# Patient Record
Sex: Female | Born: 1985
Health system: Southern US, Community
[De-identification: ages and names within clinical notes are randomized; demographics above are authoritative.]

## PROBLEM LIST (undated history)

## (undated) DIAGNOSIS — IMO0002 Reserved for concepts with insufficient information to code with codable children: Secondary | ICD-10-CM

## (undated) DIAGNOSIS — R7989 Other specified abnormal findings of blood chemistry: Secondary | ICD-10-CM

---

## 2008-09-24 ENCOUNTER — Emergency Department (HOSPITAL_BASED_OUTPATIENT_CLINIC_OR_DEPARTMENT_OTHER): Admission: EM | Admit: 2008-09-24 | Discharge: 2008-09-24 | Payer: Self-pay | Admitting: Emergency Medicine

## 2008-09-24 ENCOUNTER — Ambulatory Visit: Payer: Self-pay | Admitting: Radiology

## 2008-12-14 ENCOUNTER — Emergency Department (HOSPITAL_BASED_OUTPATIENT_CLINIC_OR_DEPARTMENT_OTHER): Admission: EM | Admit: 2008-12-14 | Discharge: 2008-12-14 | Payer: Self-pay | Admitting: Emergency Medicine

## 2008-12-15 ENCOUNTER — Emergency Department (HOSPITAL_BASED_OUTPATIENT_CLINIC_OR_DEPARTMENT_OTHER): Admission: EM | Admit: 2008-12-15 | Discharge: 2008-12-16 | Payer: Self-pay | Admitting: Emergency Medicine

## 2010-07-08 LAB — BASIC METABOLIC PANEL
CO2: 27 mEq/L (ref 19–32)
Calcium: 9.5 mg/dL (ref 8.4–10.5)
Chloride: 102 mEq/L (ref 96–112)
Chloride: 104 mEq/L (ref 96–112)
GFR calc Af Amer: >60 mL/min (ref >60)
GFR calc non Af Amer: 55 mL/min — ABNORMAL LOW (ref >60)
Glucose, Bld: 136 mg/dL — ABNORMAL HIGH (ref 70–99)
Potassium: 3.8 mEq/L (ref 3.5–5.1)
Sodium: 140 mEq/L (ref 135–145)
Sodium: 140 mEq/L (ref 135–145)

## 2010-07-08 LAB — DIFFERENTIAL
Basophils Absolute: 0 10*3/uL (ref 0.0–0.1)
Basophils Relative: 1 % (ref 0–1)
Eosinophils Absolute: 0.1 10*3/uL (ref 0.0–0.7)
Eosinophils Absolute: 0.1 10*3/uL (ref 0.0–0.7)
Eosinophils Relative: 1 % (ref 0–5)
Eosinophils Relative: 2 % (ref 0–5)
Lymphocytes Relative: 38 % (ref 12–46)
Lymphs Abs: 2.4 10*3/uL (ref 0.7–4.0)
Monocytes Absolute: 0.5 10*3/uL (ref 0.1–1.0)
Monocytes Relative: 6 % (ref 3–12)
Monocytes Relative: 8 % (ref 3–12)
Neutro Abs: 5.3 10*3/uL (ref 1.7–7.7)

## 2010-07-08 LAB — CBC
HCT: 47.8 % — ABNORMAL HIGH (ref 36.0–46.0)
Hemoglobin: 14.7 g/dL (ref 12.0–15.0)
Hemoglobin: 16 g/dL — ABNORMAL HIGH (ref 12.0–15.0)
MCHC: 33 g/dL (ref 30.0–36.0)
MCV: 83.3 fL (ref 78.0–100.0)
MCV: 83.7 fL (ref 78.0–100.0)
RBC: 5.32 MIL/uL — ABNORMAL HIGH (ref 3.87–5.11)
RBC: 5.74 MIL/uL — ABNORMAL HIGH (ref 3.87–5.11)
RDW: 13 % (ref 11.5–15.5)
WBC: 6.2 10*3/uL (ref 4.0–10.5)

## 2010-07-08 LAB — URINALYSIS, ROUTINE W REFLEX MICROSCOPIC
Glucose, UA: NEGATIVE mg/dL
Ketones, ur: 40 mg/dL — AB
Protein, ur: 100 mg/dL — AB

## 2010-07-08 LAB — GC/CHLAMYDIA PROBE AMP, GENITAL
Chlamydia, DNA Probe: NEGATIVE
GC Probe Amp, Genital: NEGATIVE

## 2010-07-08 LAB — URINE CULTURE: Colony Count: 30000

## 2010-07-08 LAB — WET PREP, GENITAL: WBC, Wet Prep HPF POC: NONE SEEN

## 2010-07-08 LAB — URINE MICROSCOPIC-ADD ON

## 2011-03-29 ENCOUNTER — Emergency Department (HOSPITAL_BASED_OUTPATIENT_CLINIC_OR_DEPARTMENT_OTHER)
Admission: EM | Admit: 2011-03-29 | Discharge: 2011-03-29 | Disposition: A | Discharge status: Home / Self Care | Payer: 59 | Attending: Emergency Medicine | Admitting: Emergency Medicine

## 2011-03-29 ENCOUNTER — Encounter: Payer: Self-pay | Admitting: *Deleted

## 2011-03-29 DIAGNOSIS — J069 Acute upper respiratory infection, unspecified: Secondary | ICD-10-CM | POA: Insufficient documentation

## 2011-03-29 DIAGNOSIS — J029 Acute pharyngitis, unspecified: Secondary | ICD-10-CM | POA: Insufficient documentation

## 2011-03-29 DIAGNOSIS — IMO0001 Reserved for inherently not codable concepts without codable children: Secondary | ICD-10-CM | POA: Insufficient documentation

## 2011-03-29 DIAGNOSIS — H6690 Otitis media, unspecified, unspecified ear: Secondary | ICD-10-CM | POA: Insufficient documentation

## 2011-03-29 DIAGNOSIS — H6691 Otitis media, unspecified, right ear: Secondary | ICD-10-CM

## 2011-03-29 MED ORDER — HYDROCOD POLST-CHLORPHEN POLST 10-8 MG/5ML PO LQCR: 5.0000 mL | Freq: Two times a day (BID) | ORAL | Status: DC | PRN

## 2011-03-29 MED ORDER — AMOXICILLIN 500 MG PO CAPS
500.0000 mg | ORAL_CAPSULE | Freq: Once | ORAL | Status: AC
  Administered 2011-03-29: 500 mg via ORAL
  Filled 2011-03-29: qty 1

## 2011-03-29 MED ORDER — AMOXICILLIN 500 MG PO CAPS: 500.0000 mg | ORAL_CAPSULE | Freq: Three times a day (TID) | ORAL | Status: AC

## 2011-03-29 NOTE — ED Notes (Signed)
Pt reports sore throat, congestion, cough and right sided earache xseveral days. Denies fevers. Taking cold/flu meds without relief.

## 2011-03-29 NOTE — ED Provider Notes (Signed)
History     CSN: [id number]  Arrival date & time 03/29/11  0547   First MD Initiated Contact with Patient 03/29/11 [phone]      Chief Complaint  Patient presents with  . URI    (Consider location/radiation/quality/duration/timing/severity/associated sxs/prior treatment) HPI Comments: Pt. Reporting nasal congestion, rhinorrhea, non-productive cough, right ear pain for 3 days without fever/chills, nausea/vomiting.  Her son is sick with similar symptoms.  Patient is a 25 y.o. female presenting with URI. The history is provided by the patient.  URI The primary symptoms include fatigue, headaches, ear pain, sore throat, swollen glands, cough and myalgias. Primary symptoms do not include fever, wheezing, abdominal pain, nausea, vomiting, arthralgias or rash. The current episode started 3 to 5 days ago. This is a new problem. The problem has been gradually worsening.  The headache is not associated with photophobia, eye pain, neck stiffness or weakness.  The sore throat is not accompanied by trouble swallowing, drooling or stridor.  The swelling is not associated with trouble swallowing or neck pain.  The myalgias are not associated with weakness.  The onset of the illness is associated with exposure to sick contacts. Symptoms associated with the illness include chills, plugged ear sensation, congestion and rhinorrhea. The illness is not associated with facial pain or sinus pressure.    History reviewed. No pertinent past medical history.  History reviewed. No pertinent past surgical history.  No family history on file.  History  Substance Use Topics  . Smoking status: Never Smoker   . Smokeless tobacco: Not on file  . Alcohol Use: Yes     occasionally    OB History    Grav Para Term Preterm Abortions TAB SAB Ect Mult Living                  Review of Systems  Constitutional: Positive for chills and fatigue. Negative for fever, diaphoresis, activity change, appetite change and  unexpected weight change.  HENT: Positive for ear pain, congestion, sore throat, rhinorrhea and postnasal drip. Negative for hearing loss, nosebleeds, facial swelling, sneezing, drooling, mouth sores, trouble swallowing, neck pain, neck stiffness, dental problem, voice change, sinus pressure, tinnitus and ear discharge.   Eyes: Negative for photophobia, pain, discharge, redness and itching.  Respiratory: Positive for cough. Negative for choking, chest tightness, shortness of breath, wheezing and stridor.   Cardiovascular: Negative for chest pain, palpitations and leg swelling.  Gastrointestinal: Negative for nausea, vomiting, abdominal pain, diarrhea, constipation, blood in stool, abdominal distention and anal bleeding.  Genitourinary: Negative for dysuria, urgency, frequency, hematuria, flank pain and difficulty urinating.  Musculoskeletal: Positive for myalgias. Negative for back pain, joint swelling, arthralgias and gait problem.  Skin: Negative for color change, pallor, rash and wound.  Neurological: Positive for headaches. Negative for dizziness, weakness and light-headedness.  Hematological: Positive for adenopathy. Does not bruise/bleed easily.  Psychiatric/Behavioral: Negative.     Allergies  Review of patient's allergies indicates no known allergies.  Home Medications   Current Outpatient Rx  Name Route Sig Dispense Refill  . AMOXICILLIN 500 MG PO CAPS Oral Take 1 capsule (500 mg total) by mouth 3 (three) times daily. 30 capsule 0  . HYDROCOD POLST-CHLORPHEN POLST 10-8 MG/5ML PO LQCR Oral Take 5 mLs by mouth every 12 (twelve) hours as needed (Cough). 115 mL 0    BP 115/82  Pulse 87  Temp(Src) 98.2 F (36.8 C) (Oral)  Resp 18  Ht 5\' 2"  (1.575 m)  Wt 150 lb (68.04 kg)  BMI 27.44 kg/m2  SpO2 100%  LMP 01/17/2011  Physical Exam  Nursing note and vitals reviewed. Constitutional: She is oriented to person, place, and time. She appears well-developed and well-nourished. No  distress.  HENT:  Head: Normocephalic and atraumatic. No trismus in the jaw.  Right Ear: Hearing, external ear and ear canal normal. No drainage or tenderness. No mastoid tenderness. Tympanic membrane is erythematous and bulging.  Left Ear: Hearing, tympanic membrane, external ear and ear canal normal. No mastoid tenderness. Tympanic membrane is not erythematous and not bulging.  Nose: Mucosal edema and rhinorrhea present. No sinus tenderness or nasal deformity. Right sinus exhibits no maxillary sinus tenderness and no frontal sinus tenderness. Left sinus exhibits frontal sinus tenderness. Left sinus exhibits no maxillary sinus tenderness.  Mouth/Throat: Uvula is midline and mucous membranes are normal. No oral lesions. No uvula swelling. Posterior oropharyngeal edema and posterior oropharyngeal erythema present. No oropharyngeal exudate or tonsillar abscesses.  Eyes: Conjunctivae and EOM are normal. Pupils are equal, round, and reactive to light. Right eye exhibits no discharge. Left eye exhibits no discharge.  Neck: Normal range of motion. Neck supple. No rigidity.  Cardiovascular: Normal rate and regular rhythm.  Exam reveals no gallop and no friction rub.   No murmur heard. Pulmonary/Chest: Effort normal and breath sounds normal. No respiratory distress. She has no wheezes. She has no rales.  Abdominal: Soft. Bowel sounds are normal.  Musculoskeletal: Normal range of motion. She exhibits no edema and no tenderness.  Lymphadenopathy:    She has cervical adenopathy.       Right cervical: Superficial cervical adenopathy present.       Left cervical: Superficial cervical adenopathy present.  Neurological: She is alert and oriented to person, place, and time. No cranial nerve deficit.  Skin: Skin is warm and dry. No rash noted. No erythema.  Psychiatric: She has a normal mood and affect.    ED Course  Procedures (including critical care time)  Labs Reviewed - No data to display No results  found.   1. Right otitis media   2. Pharyngitis   3. Viral upper respiratory tract infection with cough       MDM  Apparent viral URI with cough without suggestion of pneumonia on examination, but with apparent right otitis media and pharyngitis.       Felisa Bonier, MD 03/29/11 [phone]

## 2013-01-09 ENCOUNTER — Encounter (HOSPITAL_BASED_OUTPATIENT_CLINIC_OR_DEPARTMENT_OTHER): Payer: Self-pay | Admitting: Emergency Medicine

## 2013-01-09 ENCOUNTER — Emergency Department (HOSPITAL_BASED_OUTPATIENT_CLINIC_OR_DEPARTMENT_OTHER)
Admission: EM | Admit: 2013-01-09 | Discharge: 2013-01-09 | Disposition: A | Discharge status: Home / Self Care | Payer: 59 | Attending: Emergency Medicine | Admitting: Emergency Medicine

## 2013-01-09 ENCOUNTER — Emergency Department (HOSPITAL_BASED_OUTPATIENT_CLINIC_OR_DEPARTMENT_OTHER): Payer: 59

## 2013-01-09 DIAGNOSIS — Y939 Activity, unspecified: Secondary | ICD-10-CM | POA: Insufficient documentation

## 2013-01-09 DIAGNOSIS — T148XXA Other injury of unspecified body region, initial encounter: Secondary | ICD-10-CM

## 2013-01-09 DIAGNOSIS — Y929 Unspecified place or not applicable: Secondary | ICD-10-CM | POA: Insufficient documentation

## 2013-01-09 DIAGNOSIS — S9030XA Contusion of unspecified foot, initial encounter: Secondary | ICD-10-CM | POA: Insufficient documentation

## 2013-01-09 DIAGNOSIS — IMO0002 Reserved for concepts with insufficient information to code with codable children: Secondary | ICD-10-CM | POA: Insufficient documentation

## 2013-01-09 HISTORY — DX: Other specified abnormal findings of blood chemistry: R79.89

## 2013-01-09 HISTORY — DX: Reserved for concepts with insufficient information to code with codable children: IMO0002

## 2013-01-09 MED ORDER — ONDANSETRON 8 MG PO TBDP: 8.0000 mg | ORAL_TABLET | Freq: Three times a day (TID) | ORAL | Status: DC | PRN

## 2013-01-09 MED ORDER — HYDROCODONE-ACETAMINOPHEN 5-325 MG PO TABS
1.0000 | ORAL_TABLET | Freq: Once | ORAL | Status: AC
  Administered 2013-01-09: 1 via ORAL
  Filled 2013-01-09: qty 1

## 2013-01-09 MED ORDER — HYDROCODONE-ACETAMINOPHEN 5-325 MG PO TABS: 1.0000 | ORAL_TABLET | Freq: Four times a day (QID) | ORAL | Status: DC | PRN

## 2013-01-09 NOTE — ED Notes (Signed)
Pt reports sharp pain since yest on R foot. She report pain has been increasingly worsening.

## 2013-01-09 NOTE — ED Provider Notes (Signed)
CSN: [id number]     Arrival date & time 01/09/13  0711 History   First MD Initiated Contact with Patient 01/09/13 0720     Chief Complaint  Patient presents with  . Foot Pain    R   (Consider location/radiation/quality/duration/timing/severity/associated sxs/prior Treatment) HPI Comments: Pt comes in with right foot pain. Pt had a blunt injury few days back, and started having pain on Wednesday. The pain woke her up from sleep, ans tus she comes to the ED. She has pain with ambulation, and it is throbbing. The pain is located only on the 5th toe, the base of the toe and small area proximal to the toe.   Patient is a 27 y.o. female presenting with lower extremity pain. The history is provided by the patient.  Foot Pain    Past Medical History  Diagnosis Date  . Creatinine elevation     reported by patinet on physical yrs ago   History reviewed. No pertinent past surgical history. History reviewed. No pertinent family history. History  Substance Use Topics  . Smoking status: Never Smoker   . Smokeless tobacco: Not on file  . Alcohol Use: Yes     Comment: occasionally   OB History   Grav Para Term Preterm Abortions TAB SAB Ect Mult Living                 Review of Systems  Constitutional: Positive for activity change.  Musculoskeletal: Positive for arthralgias.  Skin: Negative for rash and wound.  Hematological: Does not bruise/bleed easily.    Allergies  Review of patient's allergies indicates no known allergies.  Home Medications  No current outpatient prescriptions on file. BP 137/79  Pulse 98  Temp(Src) 98.6 F (37 C)  Resp 18  Ht 5\' 2"  (1.575 m)  Wt 150 lb (68.04 kg)  BMI 27.43 kg/m2  SpO2 99% Physical Exam  Nursing note and vitals reviewed. Constitutional: She is oriented to person, place, and time. She appears well-developed.  HENT:  Head: Atraumatic.  Eyes: EOM are normal.  Neck: Neck supple.  Pulmonary/Chest: Effort normal.  Musculoskeletal:   Right 5 toe, MTP and area just proximal to MRP are tender to palpation.  Neurological: She is alert and oriented to person, place, and time.    ED Course  Procedures (including critical care time) Labs Review Labs Reviewed - No data to display Imaging Review No results found.  MDM  No diagnosis found.  Pt comes in with cc of foot pain. She had blunt trauma to the small toe. Xrays are normal, no evidence of jones fx. Mild soft tissue swelling. Will get a walker boot, partial weight bearing, RICE and sports med f.u,     Derwood Kaplan, MD 01/09/13 [phone]

## 2013-01-09 NOTE — ED Notes (Signed)
MD at bedside. 

## 2013-01-09 NOTE — ED Notes (Signed)
Patient transported to X-ray 

## 2013-11-01 ENCOUNTER — Encounter (HOSPITAL_BASED_OUTPATIENT_CLINIC_OR_DEPARTMENT_OTHER): Payer: Self-pay | Admitting: Emergency Medicine

## 2013-11-01 ENCOUNTER — Emergency Department (HOSPITAL_BASED_OUTPATIENT_CLINIC_OR_DEPARTMENT_OTHER)
Admission: EM | Admit: 2013-11-01 | Discharge: 2013-11-01 | Discharge status: Against Medical Advice | Payer: BC Managed Care – PPO | Attending: Emergency Medicine | Admitting: Emergency Medicine

## 2013-11-01 DIAGNOSIS — K625 Hemorrhage of anus and rectum: Secondary | ICD-10-CM | POA: Insufficient documentation

## 2013-11-01 NOTE — ED Notes (Addendum)
Patient states that she has had rectal bleeding since Wednesday. States it is bright red. She saw a GI dr on Friday and has an appt for a colonoscopy next month. States that he said he saw a small tear, but wasn't sure if that was the source. Patient also states that she is having a hard time "digesting food" and is taking OTC medications to help with that. Was supposed to get bloodwork today that the GI dr ordered, but did not go.

## 2014-06-11 ENCOUNTER — Emergency Department (HOSPITAL_BASED_OUTPATIENT_CLINIC_OR_DEPARTMENT_OTHER): Payer: 59

## 2014-06-11 ENCOUNTER — Encounter (HOSPITAL_BASED_OUTPATIENT_CLINIC_OR_DEPARTMENT_OTHER): Payer: Self-pay | Admitting: *Deleted

## 2014-06-11 ENCOUNTER — Emergency Department (HOSPITAL_BASED_OUTPATIENT_CLINIC_OR_DEPARTMENT_OTHER)
Admission: EM | Admit: 2014-06-11 | Discharge: 2014-06-11 | Disposition: A | Discharge status: Home / Self Care | Payer: 59 | Attending: Emergency Medicine | Admitting: Emergency Medicine

## 2014-06-11 DIAGNOSIS — M2012 Hallux valgus (acquired), left foot: Secondary | ICD-10-CM | POA: Insufficient documentation

## 2014-06-11 DIAGNOSIS — M79672 Pain in left foot: Secondary | ICD-10-CM

## 2014-06-11 DIAGNOSIS — Z79899 Other long term (current) drug therapy: Secondary | ICD-10-CM | POA: Insufficient documentation

## 2014-06-11 DIAGNOSIS — M21612 Bunion of left foot: Secondary | ICD-10-CM

## 2014-06-11 MED ORDER — IBUPROFEN 400 MG PO TABS
400.0000 mg | ORAL_TABLET | Freq: Once | ORAL | Status: AC
  Administered 2014-06-11: 400 mg via ORAL
  Filled 2014-06-11: qty 1

## 2014-06-11 MED ORDER — HYDROCODONE-ACETAMINOPHEN 5-325 MG PO TABS: 1.0000 | ORAL_TABLET | Freq: Four times a day (QID) | ORAL | Status: DC | PRN

## 2014-06-11 MED ORDER — HYDROCODONE-ACETAMINOPHEN 5-325 MG PO TABS
1.0000 | ORAL_TABLET | Freq: Once | ORAL | Status: AC
Start: 2014-06-11 — End: 2014-06-11
  Administered 2014-06-11: 1 via ORAL
  Filled 2014-06-11: qty 1

## 2014-06-11 NOTE — Discharge Instructions (Signed)
You were seen today for foot pain. You appear to have a bunion on her foot and you may have some tendinitis in your toe. Your x-rays were negative. You need to take pain medication and rest her foot for several days. If you have continued pain, you should follow-up as directed.  Bunion You have a bunion deformity of the feet. This is more common in women. It tends to be an inherited problem. Symptoms can include pain, swelling, and deformity around the great toe. Numbness and tingling may also be present. Your symptoms are often worsened by wearing shoes that cause pressure on the bunion. Changing the type of shoes you wear helps reduce symptoms. A wide shoe decreases pressure on the bunion. An arch support may be used if you have flat feet. Avoid shoes with heels higher than two inches. This puts more pressure on the bunion. X-rays may be helpful in evaluating the severity of the problem. Other foot problems often seen with bunions include corns, calluses, and hammer toes. If the deformity or pain is severe, surgical treatment may be necessary. Keep off your painful foot as much as possible until the pain is relieved. Call your caregiver if your symptoms are worse.  SEEK IMMEDIATE MEDICAL CARE IF:  You have increased redness, pain, swelling, or other symptoms of infection. Document Released: 03/20/2005 Document Revised: 06/12/2011 Document Reviewed: 09/17/2006 [facility] Patient Information 2015 [employer], [place]. This information is not intended to replace advice given to you by your health care provider. Make sure you discuss any questions you have with your health care provider.

## 2014-06-11 NOTE — ED Notes (Signed)
Pt slightly nauseas. Requesting crackers and ice water. Will d/c when nausea resolves.

## 2014-06-11 NOTE — ED Notes (Signed)
Patient transported to X-ray 

## 2014-06-11 NOTE — ED Notes (Signed)
MD at bedside. 

## 2014-06-11 NOTE — ED Provider Notes (Signed)
CSN: [id number]     Arrival date & time 06/11/14  7846 History   First MD Initiated Contact with Patient 06/11/14 0802     Chief Complaint  Patient presents with  . Foot Pain     (Consider location/radiation/quality/duration/timing/severity/associated sxs/prior Treatment) HPI  This is a 29 year old female who presents with left foot pain. Patient reports dull throbbing pain over the left great toe and foot for the last 2-3 days. She woke up this morning and pain was more intense. She denies any injury. Currently her pain is 8 out of 10. She's not taking anything at home. Pain is worse with ambulation and flexion and extension of the toe. She denies any fevers or redness of the toe. No known history of gout.  Past Medical History  Diagnosis Date  . Creatinine elevation     reported by patinet on physical yrs ago   History reviewed. No pertinent past surgical history. No family history on file. History  Substance Use Topics  . Smoking status: Never Smoker   . Smokeless tobacco: Not on file  . Alcohol Use: Yes     Comment: occasionally   OB History    No data available     Review of Systems  Constitutional: Negative for fever.  Musculoskeletal:       Left foot pain  All other systems reviewed and are negative.     Allergies  Review of patient's allergies indicates no known allergies.  Home Medications   Prior to Admission medications   Medication Sig Start Date End Date Taking? Authorizing Provider  cholecalciferol (VITAMIN D) 1000 UNITS tablet Take 1,000 Units by mouth daily.   Yes Historical Provider, MD  HYDROcodone-acetaminophen (NORCO/VICODIN) 5-325 MG per tablet Take 1 tablet by mouth every 6 (six) hours as needed for moderate pain. 06/11/14   Shon Baton, MD  ondansetron (ZOFRAN ODT) 8 MG disintegrating tablet Take 1 tablet (8 mg total) by mouth every 8 (eight) hours as needed for nausea. 01/09/13   Ankit Rhunette Croft, MD   BP 113/73 mmHg  Pulse 64  Temp(Src)  99.3 F (37.4 C) (Oral)  Resp 16  Ht  (1.575 m)  Wt 166 lb (75.297 kg)  BMI 30.35 kg/m2  SpO2 100%  LMP 06/10/2014 Physical Exam  Constitutional: She is oriented to person, place, and time. She appears well-developed and well-nourished.  HENT:  Head: Normocephalic and atraumatic.  Cardiovascular: Normal rate.   Pulmonary/Chest: Effort normal. No respiratory distress.  Musculoskeletal:  Focused examination of the left foot reveals bunion adjacent to the great toe, limited range of motion of the toe secondary to pain, no obvious swelling, no redness or overlying erythema, nails and nail beds appear intact, no tenderness over the plantar fascia  Neurological: She is alert and oriented to person, place, and time.  Skin: Skin is warm and dry.  Psychiatric: She has a normal mood and affect.  Nursing note and vitals reviewed.   ED Course  Procedures (including critical care time) Labs Review Labs Reviewed - No data to display  Imaging Review Dg Foot Complete Left  06/11/2014   CLINICAL DATA:  Woke this morning with pain in left big toe.  EXAM: LEFT FOOT - COMPLETE 3+ VIEW  COMPARISON:  None.  FINDINGS: There is no evidence of fracture or dislocation. There is no evidence of arthropathy or other focal bone abnormality. Soft tissues are unremarkable.  IMPRESSION: Negative.   Electronically Signed   By: Veronda Prude.D.  On: 06/11/2014 08:32     EKG Interpretation None      MDM   Final diagnoses:  Foot pain, left  Bunion of great toe of left foot    Patient presents with left foot pain. Has a bunion of the left great toe but otherwise exam is reassuring. X-ray negative for acute fracture. Low suspicion at this time for septic joint or gout given no overlying skin changes and range of motion intact of the great toe. Discussed with patient conservative management with pain medication and rest. She does not tolerate ibuprofen so will be given a short course of Norco. She is to  follow-up with Dr. Pearletha ForgeHudnall if symptoms continue.  After history, exam, and medical workup I feel the patient has been appropriately medically screened and is safe for discharge home. Pertinent diagnoses were discussed with the patient. Patient was given return precautions.     Shon Batonourtney F Almalik Weissberg, MD 06/11/14 [phone]

## 2014-06-11 NOTE — ED Notes (Signed)
Pt c/o mild left foot pain x2 days but woke up this morning with severe pain. Pt denies injury.

## 2014-08-27 ENCOUNTER — Ambulatory Visit: Payer: 59 | Admitting: Podiatry

## 2015-10-07 ENCOUNTER — Encounter (HOSPITAL_BASED_OUTPATIENT_CLINIC_OR_DEPARTMENT_OTHER): Payer: Self-pay | Admitting: *Deleted

## 2015-10-07 ENCOUNTER — Emergency Department (HOSPITAL_BASED_OUTPATIENT_CLINIC_OR_DEPARTMENT_OTHER)
Admission: EM | Admit: 2015-10-07 | Discharge: 2015-10-07 | Disposition: A | Discharge status: Home / Self Care | Payer: 59 | Attending: Emergency Medicine | Admitting: Emergency Medicine

## 2015-10-07 ENCOUNTER — Emergency Department (HOSPITAL_BASED_OUTPATIENT_CLINIC_OR_DEPARTMENT_OTHER): Payer: 59

## 2015-10-07 DIAGNOSIS — Y999 Unspecified external cause status: Secondary | ICD-10-CM | POA: Insufficient documentation

## 2015-10-07 DIAGNOSIS — Z79899 Other long term (current) drug therapy: Secondary | ICD-10-CM | POA: Diagnosis not present

## 2015-10-07 DIAGNOSIS — Y929 Unspecified place or not applicable: Secondary | ICD-10-CM | POA: Diagnosis not present

## 2015-10-07 DIAGNOSIS — M25572 Pain in left ankle and joints of left foot: Secondary | ICD-10-CM | POA: Diagnosis present

## 2015-10-07 DIAGNOSIS — X501XXA Overexertion from prolonged static or awkward postures, initial encounter: Secondary | ICD-10-CM | POA: Diagnosis not present

## 2015-10-07 DIAGNOSIS — S93402A Sprain of unspecified ligament of left ankle, initial encounter: Secondary | ICD-10-CM

## 2015-10-07 DIAGNOSIS — Y9341 Activity, dancing: Secondary | ICD-10-CM | POA: Insufficient documentation

## 2015-10-07 MED ORDER — HYDROCODONE-ACETAMINOPHEN 5-325 MG PO TABS: 2.0000 | ORAL_TABLET | ORAL | Status: AC | PRN

## 2015-10-07 MED ORDER — IBUPROFEN 400 MG PO TABS: 400.0000 mg | ORAL_TABLET | Freq: Four times a day (QID) | ORAL | Status: AC | PRN

## 2015-10-07 MED FILL — IBUPROFEN 400 MG TABLET: 400 | 7 days supply | Qty: 30 | Fill #0

## 2015-10-07 MED FILL — HYDROCODON-APAP 5-325: 5-325 | 2 days supply | Qty: 6 | Fill #0

## 2015-10-07 NOTE — Discharge Instructions (Signed)
Ankle Sprain °An ankle sprain is an injury to the strong, fibrous tissues (ligaments) that hold the bones of your ankle joint together.  °CAUSES °An ankle sprain is usually caused by a fall or by twisting your ankle. Ankle sprains most commonly occur when you step on the outer edge of your foot, and your ankle turns inward. People who participate in sports are more prone to these types of injuries.  °SYMPTOMS  °· Pain in your ankle. The pain may be present at rest or only when you are trying to stand or walk. °· Swelling. °· Bruising. Bruising may develop immediately or within 1 to 2 days after your injury. °· Difficulty standing or walking, particularly when turning corners or changing directions. °DIAGNOSIS  °Your caregiver will ask you details about your injury and perform a physical exam of your ankle to determine if you have an ankle sprain. During the physical exam, your caregiver will press on and apply pressure to specific areas of your foot and ankle. Your caregiver will try to move your ankle in certain ways. An X-ray exam may be done to be sure a bone was not broken or a ligament did not separate from one of the bones in your ankle (avulsion fracture).  °TREATMENT  °Certain types of braces can help stabilize your ankle. Your caregiver can make a recommendation for this. Your caregiver may recommend the use of medicine for pain. If your sprain is severe, your caregiver may refer you to a surgeon who helps to restore function to parts of your skeletal system (orthopedist) or a physical therapist. °HOME CARE INSTRUCTIONS  °· Apply ice to your injury for 1-2 days or as directed by your caregiver. Applying ice helps to reduce inflammation and pain. °¨ Put ice in a plastic bag. °¨ Place a towel between your skin and the bag. °¨ Leave the ice on for 15-20 minutes at a time, every 2 hours while you are awake. °· Only take over-the-counter or prescription medicines for pain, discomfort, or fever as directed by  your caregiver. °· Elevate your injured ankle above the level of your heart as much as possible for 2-3 days. °· If your caregiver recommends crutches, use them as instructed. Gradually put weight on the affected ankle. Continue to use crutches or a cane until you can walk without feeling pain in your ankle. °· If you have a plaster splint, wear the splint as directed by your caregiver. Do not rest it on anything harder than a pillow for the first 24 hours. Do not put weight on it. Do not get it wet. You may take it off to take a shower or bath. °· You may have been given an elastic bandage to wear around your ankle to provide support. If the elastic bandage is too tight (you have numbness or tingling in your foot or your foot becomes cold and blue), adjust the bandage to make it comfortable. °· If you have an air splint, you may blow more air into it or let air out to make it more comfortable. You may take your splint off at night and before taking a shower or bath. Wiggle your toes in the splint several times per day to decrease swelling. °SEEK MEDICAL CARE IF:  °· You have rapidly increasing bruising or swelling. °· Your toes feel extremely cold or you lose feeling in your foot. °· Your pain is not relieved with medicine. °SEEK IMMEDIATE MEDICAL CARE IF: °· Your toes are numb or blue. °·   You have severe pain that is increasing. MAKE SURE YOU:   Understand these instructions.  Will watch your condition.  Will get help right away if you are not doing well or get worse.   This information is not intended to replace advice given to you by your health care provider. Make sure you discuss any questions you have with your health care provider.   Follow-up with Dr. Pearletha ForgeHudnall  with sports medicine for reevaluation. keep brace on your ankle. Elevate your leg as much as possible. Apply ice to affected area. Take ibuprofen as needed for pain and inflammation. Return to the ED few experience severe worsening of your  symptoms, increased pain or swelling around your ankle, numbness or tingling in your lower extremity, discoloration of your skin, fevers, chills.

## 2015-10-07 NOTE — ED Provider Notes (Signed)
CSN: [id number]     Arrival date & time 10/07/15  1025 History   First MD Initiated Contact with Patient 10/07/15 1044     No chief complaint on file.    (Consider location/radiation/quality/duration/timing/severity/associated sxs/prior Treatment) HPI  Jacqueline Chandler is a 30 y.o F with no significant pmhx who presents to the ED today c/o left ankle pain. Pt states that last night she was dancing and landed on her left ankle the wrong way. Pt states her ankle buckled and she heard it crack. Pt felt immediate pain and her ankle began to swell. She has not been able to apply pressure or ambulate on that ankle since. She has taken a total of 4 hydrocodone without relief.    Past Medical History  Diagnosis Date  . Creatinine elevation     reported by patinet on physical yrs ago   History reviewed. No pertinent past surgical history. No family history on file. Social History  Substance Use Topics  . Smoking status: Never Smoker   . Smokeless tobacco: None  . Alcohol Use: Yes     Comment: occasionally   OB History    No data available     Review of Systems  All other systems reviewed and are negative.     Allergies  Review of patient's allergies indicates no known allergies.  Home Medications   Prior to Admission medications   Medication Sig Start Date End Date Taking? Authorizing Provider  cholecalciferol (VITAMIN D) 1000 UNITS tablet Take 1,000 Units by mouth daily.   Yes Historical Provider, MD  HYDROcodone-acetaminophen (NORCO/VICODIN) 5-325 MG per tablet Take 1 tablet by mouth every 6 (six) hours as needed for moderate pain. 06/11/14  Yes Shon Batonourtney F Horton, MD  Multiple Vitamin (MULTIVITAMIN WITH MINERALS) TABS tablet Take 1 tablet by mouth daily.   Yes Historical Provider, MD   BP 118/79 mmHg  Pulse 74  Temp(Src) 98.5 F (36.9 C) (Oral)  Resp 18  Ht 5\' 2"  (1.575 m)  Wt 70.308 kg  BMI 28.34 kg/m2  SpO2 100%  LMP 10/04/2015 Physical Exam  Constitutional: She is  oriented to person, place, and time. She appears well-developed and well-nourished. No distress.  HENT:  Head: Normocephalic and atraumatic.  Eyes: Conjunctivae are normal. Right eye exhibits no discharge. Left eye exhibits no discharge. No scleral icterus.  Cardiovascular: Normal rate.   Pulmonary/Chest: Effort normal.  Musculoskeletal:  TTP and swelling noted over lateral malleolus and dorsum of left foot. Intact distal pulses. No ecchymosis or erythema. No obvious bony deformity. No TTP over anterior proximal shin. Good capillary refill.  Neurological: She is alert and oriented to person, place, and time. Coordination normal.  Skin: Skin is warm and dry. No rash noted. She is not diaphoretic. No erythema. No pallor.  Psychiatric: She has a normal mood and affect. Her behavior is normal.  Nursing note and vitals reviewed.   ED Course  Procedures (including critical care time) Labs Review Labs Reviewed - No data to display  Imaging Review Dg Ankle Complete Left  10/07/2015  CLINICAL DATA:  Rolled ankle last night while dancing, medial and lateral ankle pain EXAM: LEFT ANKLE COMPLETE - 3+ VIEW COMPARISON:  Left foot 10/07/2015 FINDINGS: Three views of left ankle submitted. No acute fracture or subluxation. Ankle mortise is preserved. No radiopaque foreign body. IMPRESSION: Negative. Electronically Signed   By: Natasha MeadLiviu  Pop M.D.   On: 10/07/2015 11:13   Dg Foot Complete Left  10/07/2015  CLINICAL DATA:  Rolled ankle  last night while dancing, medial and lateral ankle pain EXAM: LEFT FOOT - COMPLETE 3+ VIEW COMPARISON:  06/11/2014 FINDINGS: Three views of the left foot submitted. No acute fracture or subluxation. No radiopaque foreign body. IMPRESSION: Negative. Electronically Signed   By: Natasha MeadLiviu  Pop M.D.   On: 10/07/2015 11:12   I have personally reviewed and evaluated these images and lab results as part of my medical decision-making.   EKG Interpretation None      MDM   Final  diagnoses:  Ankle sprain, left, initial encounter    Patient X-Ray negative for obvious fracture or dislocation, Likely ankle sprain. Neurovascularly intact. Pain managed in ED. Pt advised to follow up with orthopedics if symptoms persist for possibility of missed fracture diagnosis. Patient given ASO brace and crutches while in ED, RICE precautions and conservative therapy recommended and discussed. Patient will be dc home & is agreeable with above plan.     Lester KinsmanSamantha Tripp [place], PA-C 10/07/15 1447  Lavera Guiseana Duo Liu, MD 10/07/15 [phone]

## 2015-10-07 NOTE — ED Notes (Signed)
C/o dancing last pm and fell on carpet. C/o left ankle and foot pain. Swelling noted. NL pulses.

## 2015-10-13 ENCOUNTER — Encounter: Payer: Self-pay | Admitting: Family Medicine

## 2015-10-13 ENCOUNTER — Ambulatory Visit (INDEPENDENT_AMBULATORY_CARE_PROVIDER_SITE_OTHER): Payer: 59 | Admitting: Family Medicine

## 2015-10-13 VITALS — BP 111/75 | HR 65 | Ht 62.0 in | Wt 155.0 lb

## 2015-10-13 DIAGNOSIS — S99912A Unspecified injury of left ankle, initial encounter: Secondary | ICD-10-CM

## 2015-10-13 NOTE — Patient Instructions (Signed)
You have a high ankle sprain. Ice the area for 15 minutes at a time, 3-4 times a day Tylenol 500mg  1-2 tabs three times a day for pain. Consider capsaicin, biofreeze, or aspercreme 4 times a day topically as well. Elevate above the level of your heart when possible Crutches if needed to help with walking Bear weight when tolerated Use laceup ankle brace to help with stability while you recover from this injury. Come out of the brace twice a day to do Up/down and alphabet exercises 2-3 sets of each. Consider physical therapy for strengthening and balance exercises in the future. If not improving as expected, we may repeat x-rays or consider further testing like an MRI. Follow up with me in 2 weeks.

## 2015-10-18 DIAGNOSIS — S99912A Unspecified injury of left ankle, initial encounter: Secondary | ICD-10-CM | POA: Insufficient documentation

## 2015-10-18 NOTE — Progress Notes (Signed)
PCP: No PCP Per Patient  Subjective:   HPI: Patient is a 30 y.o. female here for left ankle injury.  Patient reports on 7/5 she was doing a certain move when dancing when she turned ankle the wrong way (inverted). Couldn't bear weight following this. Some swelling. Pain anterior, sharp. Down to 6/10 now. Has been using ASO, elevating, icing, taking ibuprofen. Using crutches also. No skin changes, numbness. No prior injuries.  Past Medical History  Diagnosis Date  . Creatinine elevation     reported by patinet on physical yrs ago    Current Outpatient Prescriptions on File Prior to Visit  Medication Sig Dispense Refill  . cholecalciferol (VITAMIN D) 1000 UNITS tablet Take 1,000 Units by mouth daily.    Marland Kitchen. HYDROcodone-acetaminophen (NORCO/VICODIN) 5-325 MG per tablet Take 1 tablet by mouth every 6 (six) hours as needed for moderate pain. 10 tablet 0  . HYDROcodone-acetaminophen (NORCO/VICODIN) 5-325 MG tablet Take 2 tablets by mouth every 4 (four) hours as needed. 6 tablet 0  . ibuprofen (ADVIL,MOTRIN) 400 MG tablet Take 1 tablet (400 mg total) by mouth every 6 (six) hours as needed. 30 tablet 0  . Multiple Vitamin (MULTIVITAMIN WITH MINERALS) TABS tablet Take 1 tablet by mouth daily.     No current facility-administered medications on file prior to visit.    No past surgical history on file.  No Known Allergies  Social History   Social History  . Marital Status: Single    Spouse Name: N/A  . Number of Children: N/A  . Years of Education: N/A   Occupational History  . Not on file.   Social History Main Topics  . Smoking status: Never Smoker   . Smokeless tobacco: Not on file  . Alcohol Use: 0.0 oz/week    0 Standard drinks or equivalent per week     Comment: occasionally  . Drug Use: No  . Sexual Activity: Not Currently   Other Topics Concern  . Not on file   Social History Narrative    No family history on file.  BP 111/75 mmHg  Pulse 65  Ht 5\' 2"   (1.575 m)  Wt 155 lb (70.308 kg)  BMI 28.34 kg/m2  LMP 10/04/2015  Review of Systems: See HPI above.    Objective:  Physical Exam:  Gen: NAD, comfortable in exam room  Left ankle: Mild anterior swelling.  No bruising, other deformity. ROM mod limitation all directions. TTP anterior ankle joint.  No malleolar, base 5th, navicular, other tenderness. Negative ant drawer and talar tilt.   Positive syndesmotic compression. Thompsons test negative. NV intact distally.  Right ankle: FROM without pain.    Assessment & Plan:  1. Left ankle injury - independently reviewed radiographs - no fracture, other acute abnormalities.  Consistent with high ankle sprain.  Icing, tylenol, topical medications.  ASO with crutches as needed to help with ambulation.  Shown home exercises to do daily to regain motion.  Consider physical therapy in future.  F/u in 2 weeks.

## 2015-10-18 NOTE — Assessment & Plan Note (Signed)
independently reviewed radiographs - no fracture, other acute abnormalities.  Consistent with high ankle sprain.  Icing, tylenol, topical medications.  ASO with crutches as needed to help with ambulation.  Shown home exercises to do daily to regain motion.  Consider physical therapy in future.  F/u in 2 weeks.

## 2015-10-27 ENCOUNTER — Ambulatory Visit (INDEPENDENT_AMBULATORY_CARE_PROVIDER_SITE_OTHER): Payer: 59 | Admitting: Family Medicine

## 2015-10-27 ENCOUNTER — Encounter: Payer: Self-pay | Admitting: Family Medicine

## 2015-10-27 VITALS — BP 117/77 | HR 84 | Ht 62.0 in | Wt 160.0 lb

## 2015-10-27 DIAGNOSIS — S99912A Unspecified injury of left ankle, initial encounter: Secondary | ICD-10-CM

## 2015-10-27 NOTE — Patient Instructions (Signed)
We will go ahead with an MRI to assess for an occult navicular fracture. Ice the area for 15 minutes at a time, 3-4 times a day Tylenol 500mg  1-2 tabs three times a day for pain. Consider capsaicin, biofreeze, or aspercreme 4 times a day topically as well. Elevate above the level of your heart when possible Crutches if needed to help with walking I would stop the exercises for your ankle. Further treatment, follow up will depend on those results.

## 2015-11-02 NOTE — Assessment & Plan Note (Signed)
independently reviewed radiographs - no fracture, other acute abnormalities.  She has exquisite tenderness today on navicular and pain putting shoes on as a result.  Concerned about occult navicular fracture.  We will go ahead with MRI to assess.  Icing, tylenol, topical medications.  Elevation.  Crutches.  Stop home exercises for now.

## 2015-11-02 NOTE — Progress Notes (Addendum)
PCP: No PCP Per Patient  Subjective:   HPI: Patient is a 30 y.o. female here for left ankle injury.  7/12: Patient reports on 7/5 she was doing a certain move when dancing when she turned ankle the wrong way (inverted). Couldn't bear weight following this. Some swelling. Pain anterior, sharp. Down to 6/10 now. Has been using ASO, elevating, icing, taking ibuprofen. Using crutches also. No skin changes, numbness. No prior injuries.  7/26: Patient reports her ankle and foot still bother her quite a bit though swelling has improved. Pain is 4/10, sharp medially. Unable to put on a pair of shoes due to sharp pain but tries to do so. Wearing flat shoes as a result. No other skin changes, numbness.  Past Medical History:  Diagnosis Date  . Creatinine elevation    reported by patinet on physical yrs ago    Current Outpatient Prescriptions on File Prior to Visit  Medication Sig Dispense Refill  . cholecalciferol (VITAMIN D) 1000 UNITS tablet Take 1,000 Units by mouth daily.    Marland Kitchen HYDROcodone-acetaminophen (NORCO/VICODIN) 5-325 MG tablet Take 2 tablets by mouth every 4 (four) hours as needed. 6 tablet 0  . ibuprofen (ADVIL,MOTRIN) 400 MG tablet Take 1 tablet (400 mg total) by mouth every 6 (six) hours as needed. 30 tablet 0  . Multiple Vitamin (MULTIVITAMIN WITH MINERALS) TABS tablet Take 1 tablet by mouth daily.     No current facility-administered medications on file prior to visit.     No past surgical history on file.  No Known Allergies  Social History   Social History  . Marital status: Single    Spouse name: N/A  . Number of children: N/A  . Years of education: N/A   Occupational History  . Not on file.   Social History Main Topics  . Smoking status: Never Smoker  . Smokeless tobacco: Never Used  . Alcohol use 0.0 oz/week     Comment: occasionally  . Drug use: No  . Sexual activity: Not Currently   Other Topics Concern  . Not on file   Social History  Narrative  . No narrative on file    No family history on file.  BP 117/77   Pulse 84   Ht 5\' 2"  (1.575 m)   Wt 160 lb (72.6 kg)   LMP 10/04/2015   BMI 29.26 kg/m   Review of Systems: See HPI above.    Objective:  Physical Exam:  Gen: NAD, comfortable in exam room  Left ankle: Mild anterior, medial swelling.  No bruising, other deformity. ROM mod limitation all directions. TTP focally over navicular now.  Less anterior ankle joint.  No malleolar, base 5th, other tenderness. Negative ant drawer and talar tilt.   Negative syndesmotic compression. Thompsons test negative. NV intact distally.  Right ankle: FROM without pain.    Assessment & Plan:  1. Left foot/ankle injury - independently reviewed radiographs - no fracture, other acute abnormalities.  She has exquisite tenderness today on navicular and pain putting shoes on as a result.  Concerned about occult navicular fracture.  We will go ahead with MRI to assess.  Icing, tylenol, topical medications.  Elevation.  Crutches.  Stop home exercises for now.    Addendum:  MRI reviewed and discussed with patient - reassuring, only evidence of sprain.  We discussed physical therapy vs home exercises - she stated she would come in to learn some home exercises and do these daily.

## 2015-11-18 ENCOUNTER — Encounter: Payer: Self-pay | Admitting: Family Medicine

## 2015-12-27 ENCOUNTER — Encounter: Payer: Self-pay | Admitting: Family Medicine

## 2015-12-27 ENCOUNTER — Ambulatory Visit (INDEPENDENT_AMBULATORY_CARE_PROVIDER_SITE_OTHER): Payer: 59 | Admitting: Family Medicine

## 2015-12-27 DIAGNOSIS — S99912D Unspecified injury of left ankle, subsequent encounter: Secondary | ICD-10-CM

## 2015-12-27 MED ORDER — NITROGLYCERIN 0.2 MG/HR TD PT24
MEDICATED_PATCH | TRANSDERMAL | 1 refills | Status: AC
Start: 2015-12-27 — End: ?

## 2015-12-27 NOTE — Patient Instructions (Signed)
Your pain is due to posterior tibialis tendinitis. Arch support is important - something like dr. Jari Sportsmanscholls active series, spencos, superfeet, or our green insoles with scaphoid pads. Avoid flat shoes, barefoot walking as much as possible. Icing 15 minutes at a time 3-4 times a day. theraband exercises 3 sets of 10 once a day. Nitro patches 1/4th patch over affected area, change daily. Consider physical therapy, injection, custom orthotics if not improving. Follow up with me in 1 month to 6 weeks.

## 2015-12-28 NOTE — Assessment & Plan Note (Signed)
independently reviewed radiographs previously, patient also had MRI with only deltoid sprain, no other medial findings to account for her pain.  Exam suggests post tibialis tendinitis - ultrasound shows no tears in the tendon or other abnormalities.  Stressed importance of arch support, avoiding flat shoes and barefoot walking.  Icing, nitro patches, tylenol or ibuprofen if needed.  Consider PT, injection, custom orthotics if not improving.  F/u in 1 month to 6 weeks.

## 2015-12-28 NOTE — Progress Notes (Signed)
PCP: No PCP Per Patient  Subjective:   HPI: Patient is a 30 y.o. female here for left ankle injury.  7/12: Patient reports on 7/5 she was doing a certain move when dancing when she turned ankle the wrong way (inverted). Couldn't bear weight following this. Some swelling. Pain anterior, sharp. Down to 6/10 now. Has been using ASO, elevating, icing, taking ibuprofen. Using crutches also. No skin changes, numbness. No prior injuries.  7/26: Patient reports her ankle and foot still bother her quite a bit though swelling has improved. Pain is 4/10, sharp medially. Unable to put on a pair of shoes due to sharp pain but tries to do so. Wearing flat shoes as a result. No other skin changes, numbness.  9/25: Patient unfortunately continues to struggle with ankle pain. Pain is still medial, 4/10 constant sharp pain. Shoots into calf with walking. Icing as needed. She did not come in to get home exercise program. No skin changes, numbness.  Past Medical History:  Diagnosis Date  . Creatinine elevation    reported by patinet on physical yrs ago    Current Outpatient Prescriptions on File Prior to Visit  Medication Sig Dispense Refill  . cholecalciferol (VITAMIN D) 1000 UNITS tablet Take 1,000 Units by mouth daily.    Marland Kitchen. HYDROcodone-acetaminophen (NORCO/VICODIN) 5-325 MG tablet Take 2 tablets by mouth every 4 (four) hours as needed. 6 tablet 0  . ibuprofen (ADVIL,MOTRIN) 400 MG tablet Take 1 tablet (400 mg total) by mouth every 6 (six) hours as needed. 30 tablet 0  . Multiple Vitamin (MULTIVITAMIN WITH MINERALS) TABS tablet Take 1 tablet by mouth daily.     No current facility-administered medications on file prior to visit.     No past surgical history on file.  No Known Allergies  Social History   Social History  . Marital status: Single    Spouse name: N/A  . Number of children: N/A  . Years of education: N/A   Occupational History  . Not on file.   Social History  Main Topics  . Smoking status: Never Smoker  . Smokeless tobacco: Never Used  . Alcohol use 0.0 oz/week     Comment: occasionally  . Drug use: No  . Sexual activity: Not Currently   Other Topics Concern  . Not on file   Social History Narrative  . No narrative on file    No family history on file.  BP 104/72   Pulse 73   Ht 5\' 2"  (1.575 m)   Wt 165 lb (74.8 kg)   BMI 30.18 kg/m   Review of Systems: See HPI above.    Objective:  Physical Exam:  Gen: NAD, comfortable in exam room  Left ankle: Mild medial swelling.  No bruising, other deformity. FROM with pain internal and external rotation TTP proximal to navicular within post tib tendon.  No ankle joint, navicular, malleolar, other tenderness currently. Negative ant drawer and talar tilt.   Negative syndesmotic compression. Thompsons test negative. NV intact distally.  Right ankle: FROM without pain.    Assessment & Plan:  1. Left foot/ankle injury - independently reviewed radiographs previously, patient also had MRI with only deltoid sprain, no other medial findings to account for her pain.  Exam suggests post tibialis tendinitis - ultrasound shows no tears in the tendon or other abnormalities.  Stressed importance of arch support, avoiding flat shoes and barefoot walking.  Icing, nitro patches, tylenol or ibuprofen if needed.  Consider PT, injection, custom orthotics if  not improving.  F/u in 1 month to 6 weeks.

## 2016-03-24 ENCOUNTER — Ambulatory Visit (INDEPENDENT_AMBULATORY_CARE_PROVIDER_SITE_OTHER): Payer: 59 | Admitting: Family Medicine

## 2016-03-24 ENCOUNTER — Encounter: Payer: Self-pay | Admitting: Family Medicine

## 2016-03-24 DIAGNOSIS — S99912D Unspecified injury of left ankle, subsequent encounter: Secondary | ICD-10-CM

## 2016-03-24 NOTE — Patient Instructions (Signed)
Your pain is due to posterior tibialis tendinitis. Try the cam walker with scaphoid pad for next 4-6 weeks. Consider physical therapy, injection, custom orthotics, repeating your MRI if not improving. Follow up with me in 1 month to 6 weeks.

## 2016-04-01 NOTE — Progress Notes (Signed)
PCP: No PCP Per Patient  Subjective:   HPI: Patient is a 30 y.o. female here for left ankle injury.  7/12: Patient reports on 7/5 she was doing a certain move when dancing when she turned ankle the wrong way (inverted). Couldn't bear weight following this. Some swelling. Pain anterior, sharp. Down to 6/10 now. Has been using ASO, elevating, icing, taking ibuprofen. Using crutches also. No skin changes, numbness. No prior injuries.  7/26: Patient reports her ankle and foot still bother her quite a bit though swelling has improved. Pain is 4/10, sharp medially. Unable to put on a pair of shoes due to sharp pain but tries to do so. Wearing flat shoes as a result. No other skin changes, numbness.  9/25: Patient unfortunately continues to struggle with ankle pain. Pain is still medial, 4/10 constant sharp pain. Shoots into calf with walking. Icing as needed. She did not come in to get home exercise program. No skin changes, numbness.  12/22: Patient returns today frustrated about continued left ankle pain. Pain is medial, 4/10 and constant. Feels when crossing her leg. Having pain medial left foot over bunion area also. She reports using nitro, doing home exercises. No skin changes, numbness.  Past Medical History:  Diagnosis Date  . Creatinine elevation    reported by patinet on physical yrs ago    Current Outpatient Prescriptions on File Prior to Visit  Medication Sig Dispense Refill  . cholecalciferol (VITAMIN D) 1000 UNITS tablet Take 1,000 Units by mouth daily.    Marland Kitchen. HYDROcodone-acetaminophen (NORCO/VICODIN) 5-325 MG tablet Take 2 tablets by mouth every 4 (four) hours as needed. 6 tablet 0  . ibuprofen (ADVIL,MOTRIN) 400 MG tablet Take 1 tablet (400 mg total) by mouth every 6 (six) hours as needed. 30 tablet 0  . Multiple Vitamin (MULTIVITAMIN WITH MINERALS) TABS tablet Take 1 tablet by mouth daily.    . nitroGLYCERIN (NITRODUR - DOSED IN MG/24 HR) 0.2 mg/hr patch  Apply 1/4th patch to affected ankle, change daily 30 patch 1   No current facility-administered medications on file prior to visit.     No past surgical history on file.  No Known Allergies  Social History   Social History  . Marital status: Single    Spouse name: N/A  . Number of children: N/A  . Years of education: N/A   Occupational History  . Not on file.   Social History Main Topics  . Smoking status: Never Smoker  . Smokeless tobacco: Never Used  . Alcohol use 0.0 oz/week     Comment: occasionally  . Drug use: No  . Sexual activity: Not Currently   Other Topics Concern  . Not on file   Social History Narrative  . No narrative on file    No family history on file.  BP 117/71   Pulse 86   Ht 5\' 2"  (1.575 m)   Wt 165 lb (74.8 kg)   BMI 30.18 kg/m   Review of Systems: See HPI above.    Objective:  Physical Exam:  Gen: NAD, comfortable in exam room  Left ankle: Mild medial swelling.  No bruising, other deformity. FROM with pain internal and external rotation TTP over posterior tibialis tendon.  No ankle joint, navicular, malleolar, other tenderness currently. Negative ant drawer and talar tilt.   Negative syndesmotic compression. NV intact distally.  Right ankle: FROM without pain.    Assessment & Plan:  1. Left foot/ankle injury - again reassured patient regarding her exam and  normal appearing MRI and ultrasounds.  She has not improved to date with home exercises, nitro patches, arch support.  We discussed options: physical therapy, custom orthotics, repeating the MRI (she's concerned we are 'missing something').  She prefers to try a cam walker with scaphoid pad and reevaluate in 4-6 weeks.  Continue nitro, icing if needed.

## 2016-04-01 NOTE — Assessment & Plan Note (Signed)
again reassured patient regarding her exam and normal appearing MRI and ultrasounds.  She has not improved to date with home exercises, nitro patches, arch support.  We discussed options: physical therapy, custom orthotics, repeating the MRI (she's concerned we are 'missing something').  She prefers to try a cam walker with scaphoid pad and reevaluate in 4-6 weeks.  Continue nitro, icing if needed.

## 2018-03-10 ENCOUNTER — Other Ambulatory Visit: Payer: Self-pay

## 2018-03-10 ENCOUNTER — Encounter (HOSPITAL_BASED_OUTPATIENT_CLINIC_OR_DEPARTMENT_OTHER): Payer: Self-pay | Admitting: Emergency Medicine

## 2018-03-10 ENCOUNTER — Emergency Department (HOSPITAL_BASED_OUTPATIENT_CLINIC_OR_DEPARTMENT_OTHER)
Admission: EM | Admit: 2018-03-10 | Discharge: 2018-03-10 | Disposition: A | Discharge status: Home / Self Care | Payer: 59 | Attending: Emergency Medicine | Admitting: Emergency Medicine

## 2018-03-10 DIAGNOSIS — T59811A Toxic effect of smoke, accidental (unintentional), initial encounter: Secondary | ICD-10-CM

## 2018-03-10 DIAGNOSIS — J705 Respiratory conditions due to smoke inhalation: Secondary | ICD-10-CM | POA: Diagnosis not present

## 2018-03-10 DIAGNOSIS — Z79899 Other long term (current) drug therapy: Secondary | ICD-10-CM | POA: Insufficient documentation
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
## 2018-03-10 NOTE — Discharge Instructions (Signed)
Please follow-up with your doctor as needed.  Please return the emergency department if develop any new or worsening symptoms.
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
## 2018-03-10 NOTE — Progress Notes (Signed)
RT note: Patient had a co oximeter of 3.0. Will continue to monitor.

## 2018-03-10 NOTE — ED Notes (Signed)
Patient verbalizes understanding of discharge instructions. Opportunity for questioning and answers were provided. Armband removed by staff, pt discharged from ED.  

## 2018-03-26 ENCOUNTER — Emergency Department (HOSPITAL_BASED_OUTPATIENT_CLINIC_OR_DEPARTMENT_OTHER)
Admission: EM | Admit: 2018-03-26 | Discharge: 2018-03-26 | Disposition: A | Discharge status: Home / Self Care | Payer: 59 | Attending: Emergency Medicine | Admitting: Emergency Medicine

## 2018-03-26 ENCOUNTER — Other Ambulatory Visit: Payer: Self-pay

## 2018-03-26 ENCOUNTER — Encounter (HOSPITAL_BASED_OUTPATIENT_CLINIC_OR_DEPARTMENT_OTHER): Payer: Self-pay

## 2018-03-26 ENCOUNTER — Emergency Department (HOSPITAL_BASED_OUTPATIENT_CLINIC_OR_DEPARTMENT_OTHER): Payer: 59

## 2018-03-26 DIAGNOSIS — Z79899 Other long term (current) drug therapy: Secondary | ICD-10-CM | POA: Diagnosis not present

## 2018-03-26 DIAGNOSIS — R0789 Other chest pain: Secondary | ICD-10-CM | POA: Insufficient documentation

## 2018-03-26 DIAGNOSIS — R079 Chest pain, unspecified: Secondary | ICD-10-CM | POA: Diagnosis present

## 2018-03-26 MED ORDER — DICLOFENAC SODIUM 1 % TD GEL: 2.0000 g | Freq: Four times a day (QID) | TRANSDERMAL | 0 refills | Status: AC

## 2018-03-26 NOTE — ED Provider Notes (Signed)
[facility] EMERGENCY DEPARTMENT Provider Note   CSN: [id number] Arrival date & time: 03/26/18  1355     History   Chief Complaint Chief Complaint  Patient presents with  . Chest Pain    HPI Jacqueline Chandler is a 32 y.o. female presenting with intermittent middle and left sided chest pain onset 2 days ago. Patient describes pain as an ache. Patient reports edema and tenderness to palpation of the chest. Patient states symptoms are worse with moving her left arm and better with rest. Patient states she has tried tylenol and a heating pad without relief. Patient reports an intermittent dry cough. Patient denies trauma, fever, chills, vomiting, diarrhea, congestion, or rhinorrhea. Denies DOE, SOB, chest tightness or pressure, radiation to left arm, jaw or back, nausea, or diaphoresis. Patient denies tobacco use, recent travel, recent surgery, OCPs, leg edema/pain, history of DVT/PE, or family history of DVT/PE. Patient denies a personal cardiac history or a family history of cardiac problems.    HPI  Past Medical History:  Diagnosis Date  . Creatinine elevation    reported by patinet on physical yrs ago    Patient Active Problem List   Diagnosis Date Noted  . Left ankle injury 10/18/2015    History reviewed. No pertinent surgical history.   OB History   No obstetric history on file.      Home Medications    Prior to Admission medications   Medication Sig Start Date End Date Taking? Authorizing Provider  cholecalciferol (VITAMIN D) 1000 UNITS tablet Take 1,000 Units by mouth daily.    [provider]  diclofenac sodium (VOLTAREN) 1 % GEL Apply 2 g topically 4 (four) times daily. 03/26/18   Carlyle BasquesHernandez, Wyett Narine P, PA-C  HYDROcodone-acetaminophen (NORCO/VICODIN) 5-325 MG tablet Take 2 tablets by mouth every 4 (four) hours as needed. 10/07/15   Dowless, Samantha Tripp, PA-C  ibuprofen (ADVIL,MOTRIN) 400 MG tablet Take 1 tablet (400 mg total) by mouth every 6 (six)  hours as needed. 10/07/15   Dowless, Lelon MastSamantha Tripp, PA-C  Multiple Vitamin (MULTIVITAMIN WITH MINERALS) TABS tablet Take 1 tablet by mouth daily.    [provider]  nitroGLYCERIN (NITRODUR - DOSED IN MG/24 HR) 0.2 mg/hr patch Apply 1/4th patch to affected ankle, change daily 12/27/15   Hudnall, Azucena FallenShane R, MD    Family History No family history on file.  Social History Social History   Tobacco Use  . Smoking status: Never Smoker  . Smokeless tobacco: Never Used  Substance Use Topics  . Alcohol use: Yes    Alcohol/week: 0.0 standard drinks    Comment: occasionally  . Drug use: No     Allergies   Patient has no known allergies.   Review of Systems Review of Systems  Constitutional: Negative for activity change, appetite change, chills, diaphoresis, fatigue, fever and unexpected weight change.  HENT: Negative for congestion and rhinorrhea.   Respiratory: Positive for cough. Negative for chest tightness, shortness of breath and wheezing.   Cardiovascular: Positive for chest pain. Negative for palpitations and leg swelling.  Gastrointestinal: Negative for abdominal pain, nausea and vomiting.  Endocrine: Negative for cold intolerance and heat intolerance.  Musculoskeletal: Negative for back pain.  Skin: Negative for rash.  Allergic/Immunologic: Negative for immunocompromised state.  Neurological: Negative for dizziness, syncope, weakness and light-headedness.  Psychiatric/Behavioral: Negative for agitation and behavioral problems. The patient is not nervous/anxious.      Physical Exam Updated Vital Signs BP 133/75 (BP Location: Left Arm)   Pulse 96  Temp 98.4 F (36.9 C) (Oral)   Resp 18   Ht 5\' 2"  (1.575 m)   Wt 80.3 kg   LMP 03/25/2018   SpO2 98%   BMI 32.37 kg/m   Physical Exam Vitals signs and nursing note reviewed.  Constitutional:      General: She is not in acute distress.    Appearance: She is well-developed. She is not diaphoretic.  HENT:      Head: Normocephalic and atraumatic.  Neck:     Musculoskeletal: Normal range of motion and neck supple.     Vascular: No JVD.  Cardiovascular:     Rate and Rhythm: Normal rate and regular rhythm.     Pulses: Normal pulses.          Radial pulses are 2+ on the right side and 2+ on the left side.       Dorsalis pedis pulses are 2+ on the right side and 2+ on the left side.     Heart sounds: Normal heart sounds. No murmur. No friction rub. No gallop.   Pulmonary:     Effort: Pulmonary effort is normal. No respiratory distress.     Breath sounds: Normal breath sounds. No wheezing or rales.  Chest:     Chest wall: Tenderness (Tenderness upon palpation of middle of chest.) present. No edema.  Abdominal:     Palpations: Abdomen is soft.     Tenderness: There is no abdominal tenderness.  Musculoskeletal: Normal range of motion.     Right lower leg: She exhibits no tenderness. No edema.     Left lower leg: She exhibits no tenderness. No edema.  Skin:    General: Skin is warm.     Capillary Refill: Capillary refill takes less than 2 seconds.     Coloration: Skin is not pale.     Findings: No rash.  Neurological:     Mental Status: She is alert and oriented to person, place, and time.      ED Treatments / Results  Labs (all labs ordered are listed, but only abnormal results are displayed) Labs Reviewed - No data to display  EKG EKG Interpretation  Date/Time:  Tuesday March 26 2018 14:01:32 EST Ventricular Rate:  81 PR Interval:  120 QRS Duration: 70 QT Interval:  326 QTC Calculation: 378 R Axis:   53 Text Interpretation:  Normal sinus rhythm Normal ECG No old tracing to compare Reconfirmed by [place] 5Jacubowitz, Doreatha MartinSam [phone]([id number]) on 03/26/2018 2:05:48 PM   Radiology Dg Chest 2 View  Result Date: 03/26/2018 CLINICAL DATA:  Left-sided chest pain for 3 days. EXAM: CHEST - 2 VIEW COMPARISON:  None. FINDINGS: The cardiac silhouette, mediastinal and hilar contours are normal. The lungs are  clear. No pleural effusion. The bony thorax is intact. IMPRESSION: No acute cardiopulmonary findings. Electronically Signed   By: Rudie MeyerP.  Gallerani M.D.   On: 03/26/2018 14:59    Procedures Procedures (including critical care time)  Medications Ordered in ED Medications - No data to display   Initial Impression / Assessment and Plan / ED Course  I have reviewed the triage vital signs and the nursing notes.  Pertinent labs & imaging results that were available during my care of the patient were reviewed by me and considered in my medical decision making (see chart for details).  Clinical Course as of Mar 26 1528  Tue Mar 26, 2018  1508 CXR reveals no acute cardiopulmonary findings.  DG Chest 2 View [AH]    Clinical Course  User Index [AH] Leretha Dykes, PA-C   Patient is to be discharged with recommendation to follow up with PCP in regards to today's hospital visit. Chest pain is not likely of cardiac or pulmonary etiology d/t presentation, PERC negative, VSS, no tracheal deviation, no JVD or new murmur, RRR, breath sounds equal bilaterally, EKG without acute abnormalities, and negative CXR. Pt has been advised to return to the ED if CP becomes exertional, associated with diaphoresis or nausea, radiates to left jaw/arm, worsens or becomes concerning in any way. Pt appears reliable for follow up and is agreeable to discharge.   Final Clinical Impressions(s) / ED Diagnoses   Final diagnoses:  Chest wall pain    ED Discharge Orders         Ordered    diclofenac sodium (VOLTAREN) 1 % GEL  4 times daily     03/26/18 [address] [place], [place] 03/26/18 1529    Doug Sou, MD 03/26/18 [phone]

## 2018-03-26 NOTE — ED Notes (Signed)
Patient transported to X-ray 

## 2018-03-26 NOTE — ED Triage Notes (Addendum)
C/o CP x 2 days-NAD-steady gait-c/o swelling to area and tender to touch-denies injury-pain worse with left arm movement

## 2018-03-26 NOTE — Discharge Instructions (Addendum)
You have been seen today for chest pain. Please read and follow all provided instructions.   1. Medications: voltaren gel for chest pain, usual home medications 2. Treatment: rest, drink plenty of fluids 3. Follow Up: Please follow up with your primary doctor in 2 days for discussion of your diagnoses and further evaluation after today's visit; if you do not have a primary care doctor use the resource guide provided to find one; Please return to the ER for any new or worsening symptoms. Please obtain all of your results from medical records or have your doctors office obtain the results - share them with your doctor - you should be seen at your doctors office. Call today to arrange your follow up.   Take medications as prescribed. Please review all of the medicines and only take them if you do not have an allergy to them. Return to the emergency room for worsening condition or new concerning symptoms. Follow up with your regular doctor. If you don't have a regular doctor use one of the numbers below to establish a primary care doctor.  Please be aware that if you are taking birth control pills, taking other prescriptions, ESPECIALLY ANTIBIOTICS may make the birth control ineffective - if this is the case, either do not engage in sexual activity or use alternative methods of birth control such as condoms until you have finished the medicine and your family doctor says it is OK to restart them. If you are on a blood thinner such as COUMADIN, be aware that any other medicine that you take may cause the coumadin to either work too much, or not enough - you should have your coumadin level rechecked in next 7 days if this is the case.  ?  It is also a possibility that you have an allergic reaction to any of the medicines that you have been prescribed - Everybody reacts differently to medications and while MOST people have no trouble with most medicines, you may have a reaction such as nausea, vomiting, rash,  swelling, shortness of breath. If this is the case, please stop taking the medicine immediately and contact your physician.  ?  You should return to the ER if you develop severe or worsening symptoms.   Emergency Department Resource Guide 1) Find a Doctor and Pay Out of Pocket Although you won't have to find out who is covered by your insurance plan, it is a good idea to ask around and get recommendations. You will then need to call the office and see if the doctor you have chosen will accept you as a new patient and what types of options they offer for patients who are self-pay. Some doctors offer discounts or will set up payment plans for their patients who do not have insurance, but you will need to ask so you aren't surprised when you get to your appointment.  2) Contact Your Local Health Department Not all health departments have doctors that can see patients for sick visits, but many do, so it is worth a call to see if yours does. If you don't know where your local health department is, you can check in your phone book. The CDC also has a tool to help you locate your state's health department, and many state websites also have listings of all of their local health departments.  3) Find a Walk-in Clinic If your illness is not likely to be very severe or complicated, you may want to try a walk in clinic. These  are popping up all over the country in pharmacies, drugstores, and shopping centers. They're usually staffed by nurse practitioners or physician assistants that have been trained to treat common illnesses and complaints. They're usually fairly quick and inexpensive. However, if you have serious medical issues or chronic medical problems, these are probably not your best option.  No Primary Care Doctor: Call Health Connect at  [phone] - they can help you locate a primary care doctor that  accepts your insurance, provides certain services, etc. Physician Referral [phone]  Emergency Department Resource Guide 1) Find a Doctor and Pay Out of Pocket Although you won't have to find out who is covered by your insurance plan, it is a good idea to ask around and get recommendations. You will then need to call the office and see if the doctor you have chosen will accept you as a new patient and what types of options they offer for patients who are self-pay. Some doctors offer discounts or will set up payment plans for their patients who do not have insurance, but you will need to ask so you aren't surprised when you get to your appointment.  2) Contact Your Local Health Department Not all health departments have doctors that can see patients for sick visits, but many do, so it is worth a call to see if yours does. If you don't know where your local health department is, you can check in your phone book. The CDC also has a tool to help you locate your state's health department, and many state websites also have listings of all of their local health departments.  3) Find a [facility] If your illness is not likely to be very severe or complicated, you may want to try a walk in clinic. These are popping up all over the country in pharmacies, drugstores, and shopping centers. They're usually staffed by nurse practitioners or physician assistants that have been trained to treat common illnesses and complaints. They're usually fairly quick and inexpensive. However, if you have serious medical issues or chronic medical problems, these are probably not your best option.  No Primary Care Doctor: Call Health Connect at  [phone] - they can help you locate a primary care doctor that  accepts your insurance, provides certain services, etc. Physician Referral Service- [phone]  Chronic Pain Problems: Organization         Address  Phone   Notes  [facility]  [phone] Patients need to be referred by their primary care doctor.    Medication Assistance: Organization         Address  Phone   Notes  [facility] Medication [facility] [address] [place], [place] [address] [phone] --Must be a resident of [facility] -- Must have NO insurance coverage whatsoever (no Medicaid/ Medicare, etc.) -- The pt. MUST have a primary care doctor that directs their care regularly and follows them in the community   [facility]  [phone]   [employer]  [phone]    Agencies that provide inexpensive medical care: Organization         Address  Phone   Notes  [place]  [phone]   [facility]    [phone]   [facility] [place],  [phone]   [place] [address] [place] [phone]   [facility]    (  [phone]   [facility]    [phone]   [facility]  [address], [employer] [place] Phone:  [phone], Fax:  [phone] Hours of Operation:  9 am - 6 pm, M-F.  Also accepts Medicaid/Medicare and self-pay.  [facility] for [place] [address], [address], [place] Phone: [phone], Fax: [phone]. Hours of Operation:  8:30 am - 5:30 pm, M-F.  Also accepts Medicaid and self-pay.  [facility] [address], [place] Phone: [phone]   [place], [place], [place] [phone], Ext. [phone] Mondays & Thursdays: 7-9 AM.  First 15 patients are seen on a first come, first serve basis.    [place] Providers:  Organization         Address  Phone   Notes  [facility] [address], [place] [phone] Also accepts self-pay patients.  [facility] [address], [place]  [phone]   [place], [address], [place] [phone]   [facility] [address] [place] [phone]   Lucianne Lei [address], [place]   [phone] Only accepts [place] [facility] [place] patients after they have their name applied to their card.   Self-Pay (no insurance) in [facility]:  Organization         Address  Phone   Notes  Sickle Cell Patients, [facility] [place] [phone]   [facility] [place] [phone]   [facility] [place]  [place], [place], [place] [phone]   [facility]/Dr. Osei-Bonsu  [address], [place] or [address], [place] [phone] Phone number for both [place] and [place] locations is the same.  [facility] and [facility] [address], [place] [phone]   [facility] [address] [place] or [address] [phone]   [facility] [address] [place] [phone], phone; [phone], fax Sees patients 1st and 3rd Saturday of every month.  Must not qualify for public or private insurance (i.e. Medicaid, Medicare, [place] [employer] Choice, Veterans' Benefits)  Household income should be no more than 200% of the poverty level The clinic cannot treat you if you are pregnant or think you are pregnant  Sexually transmitted diseases are not treated at the clinic.

## 2018-06-07 ENCOUNTER — Ambulatory Visit: Payer: 59 | Admitting: Sports Medicine

## 2019-10-29 IMAGING — CR DG CHEST 2V
2 series · 2 of 2 positions shown · non-contrast
Comparison: None.

CLINICAL DATA: Left-sided chest pain for 3 days.

EXAM:
CHEST - 2 VIEW

[w chest pa]
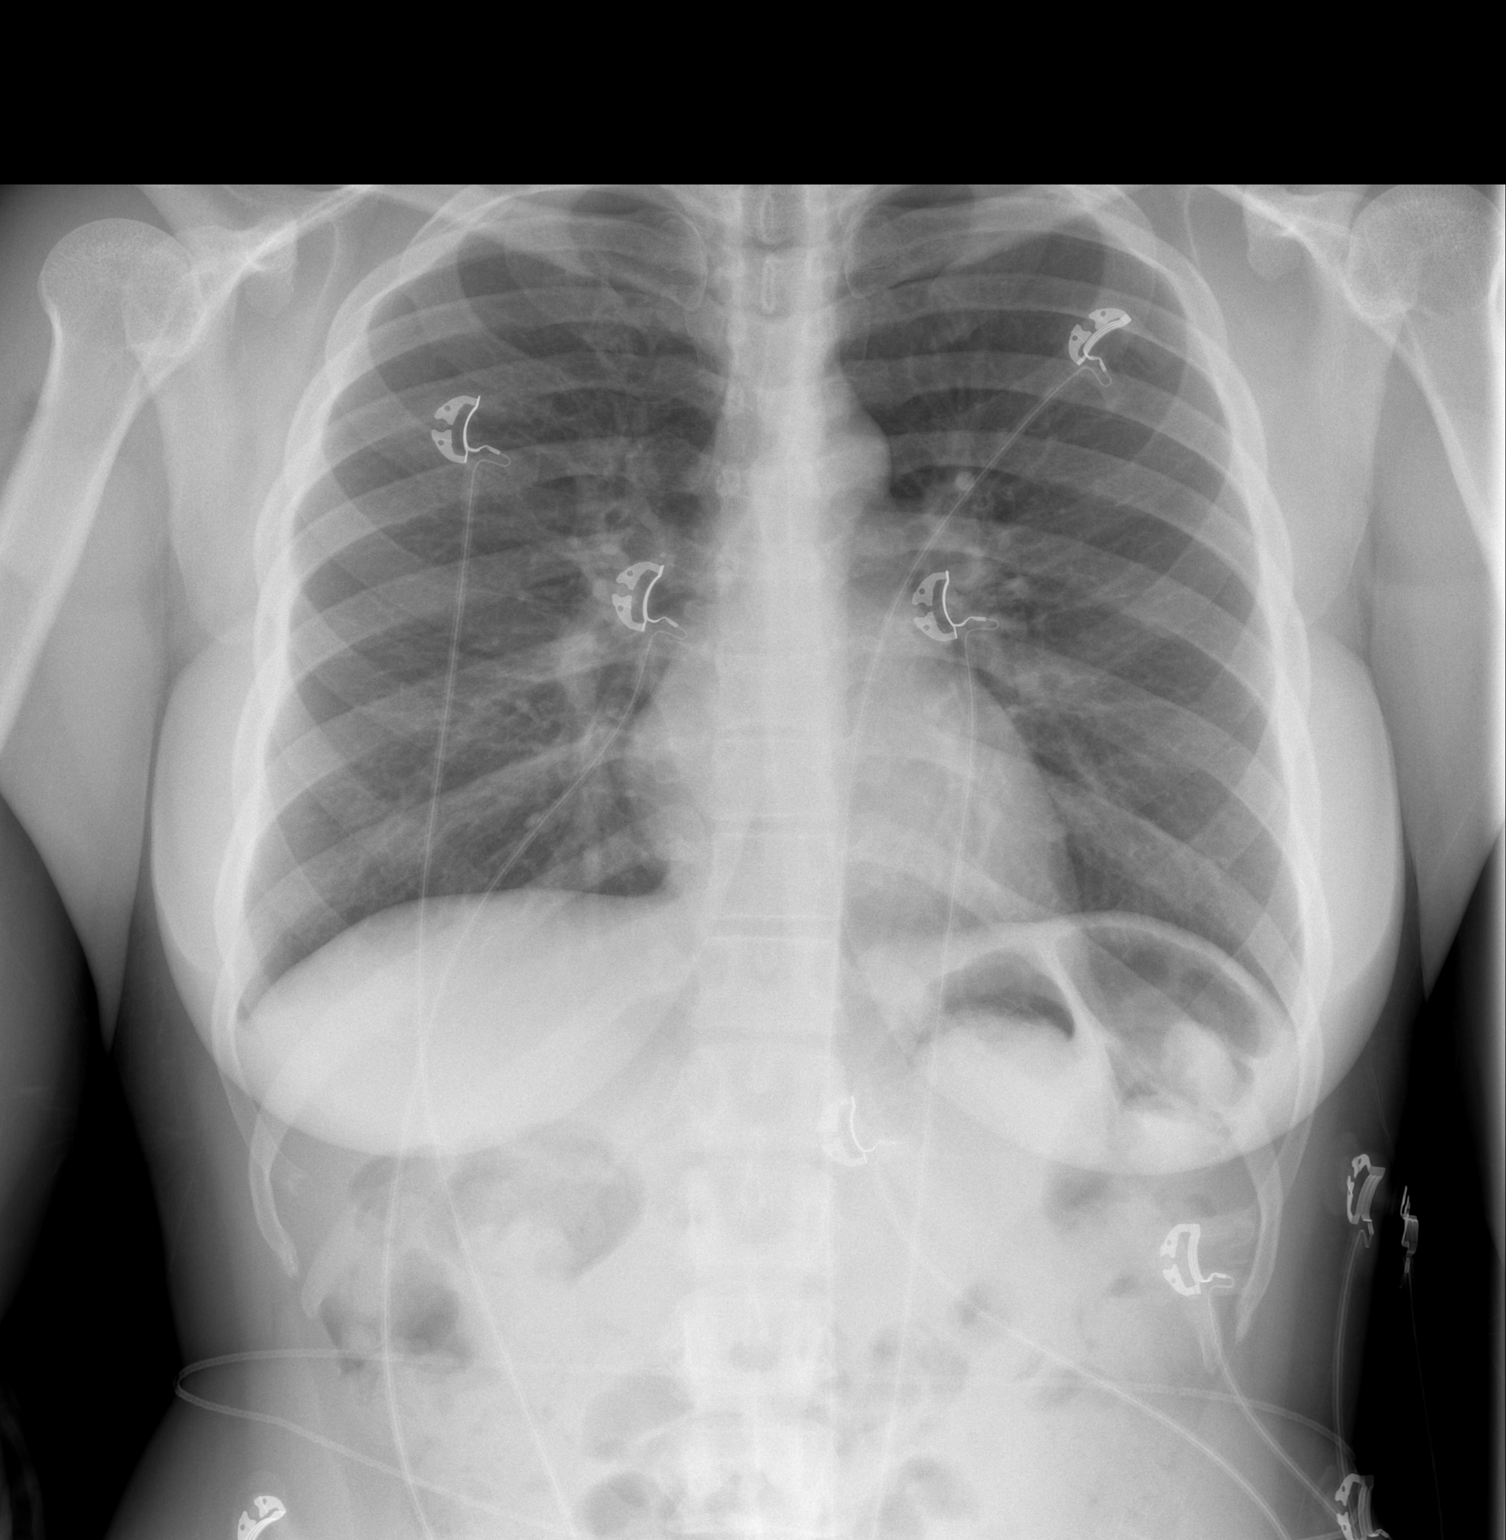

[w chest lat]
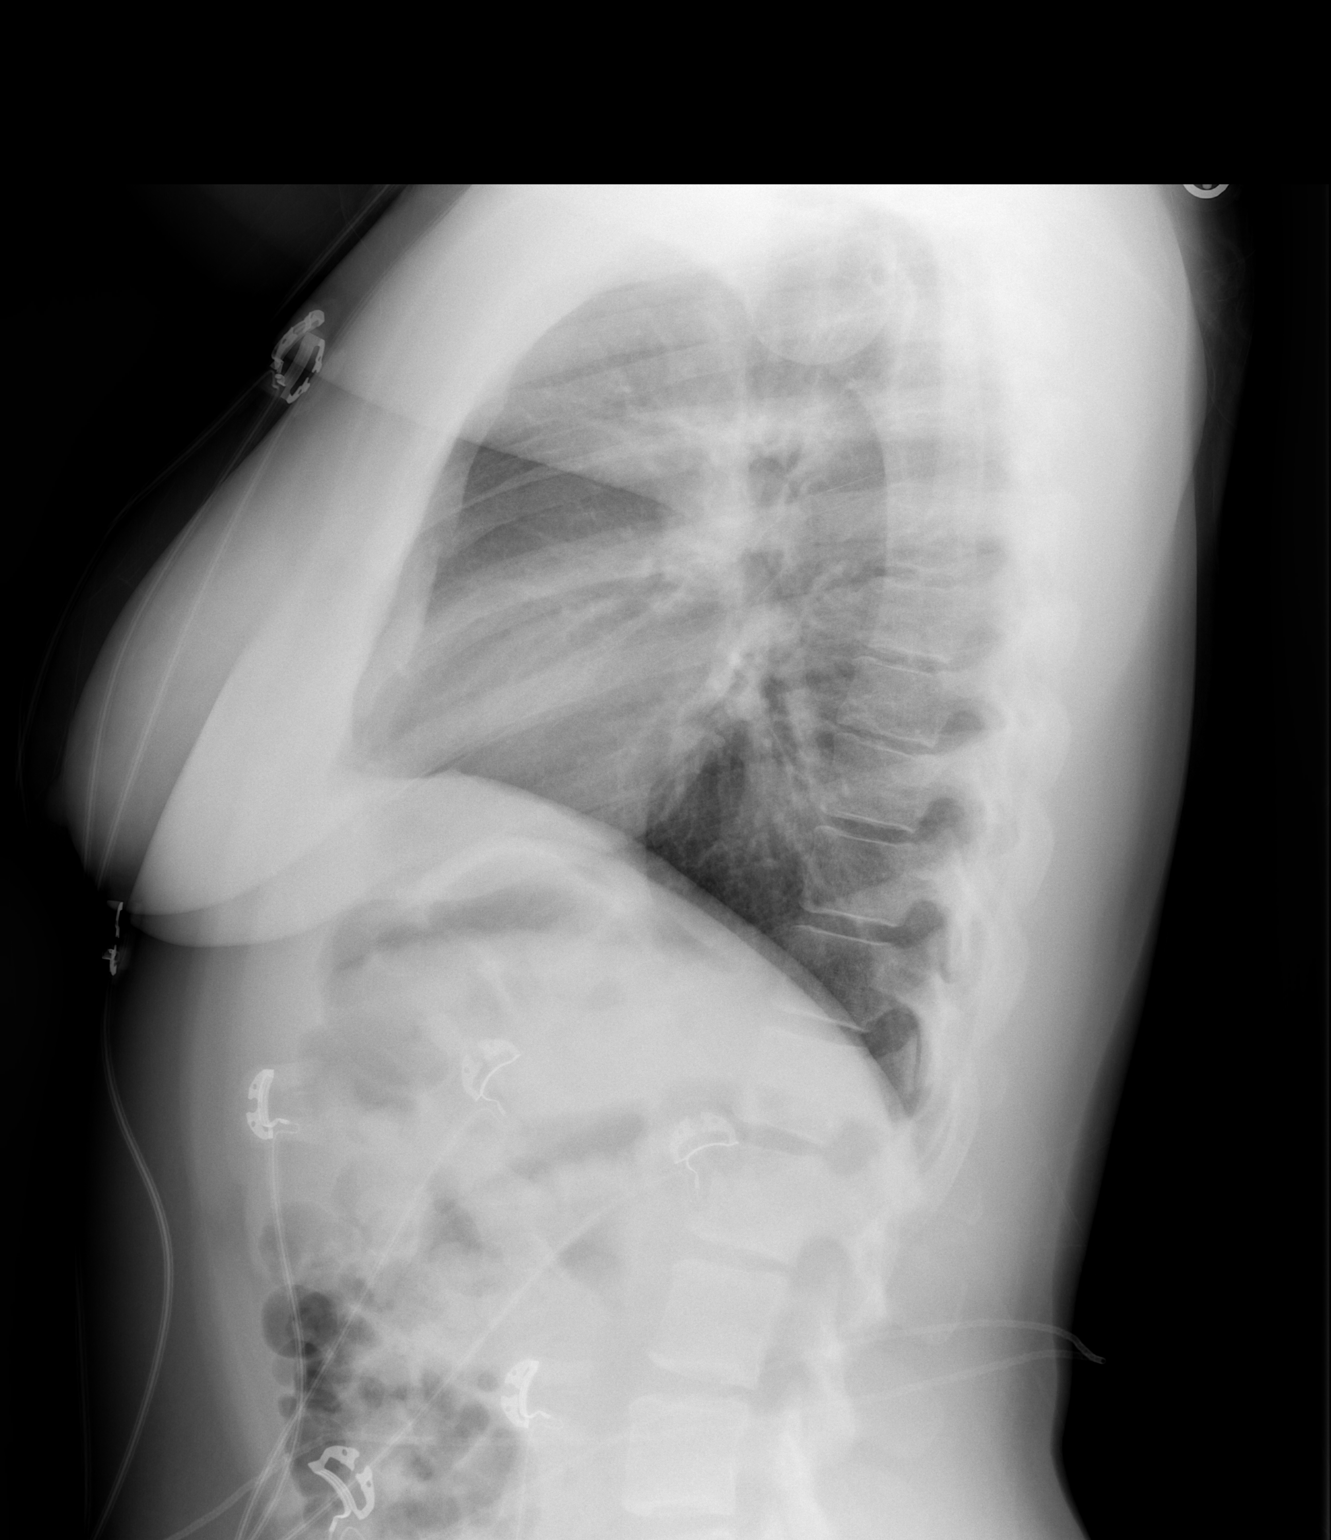

[2 of 2 positions shown; findings below may reference images not displayed]

FINDINGS: The cardiac silhouette, mediastinal and hilar contours are normal.
The lungs are clear. No pleural effusion. The bony thorax is intact.
IMPRESSION: No acute cardiopulmonary findings.

## 2022-03-15 ENCOUNTER — Other Ambulatory Visit: Payer: Self-pay

## 2022-03-15 ENCOUNTER — Emergency Department (HOSPITAL_BASED_OUTPATIENT_CLINIC_OR_DEPARTMENT_OTHER)
Admission: EM | Admit: 2022-03-15 | Discharge: 2022-03-15 | Disposition: A | Discharge status: Home / Self Care | Payer: Commercial Managed Care - HMO | Attending: Emergency Medicine | Admitting: Emergency Medicine

## 2022-03-15 ENCOUNTER — Emergency Department (HOSPITAL_BASED_OUTPATIENT_CLINIC_OR_DEPARTMENT_OTHER): Payer: Commercial Managed Care - HMO

## 2022-03-15 ENCOUNTER — Encounter (HOSPITAL_BASED_OUTPATIENT_CLINIC_OR_DEPARTMENT_OTHER): Payer: Self-pay | Admitting: Pediatrics

## 2022-03-15 DIAGNOSIS — J029 Acute pharyngitis, unspecified: Secondary | ICD-10-CM | POA: Diagnosis present

## 2022-03-15 DIAGNOSIS — J101 Influenza due to other identified influenza virus with other respiratory manifestations: Secondary | ICD-10-CM | POA: Diagnosis not present

## 2022-03-15 LAB — GROUP A STREP BY PCR: Group A Strep by PCR: NOT DETECTED

## 2022-03-15 LAB — PREGNANCY, URINE: Preg Test, Ur: NEGATIVE

## 2022-03-15 MED ORDER — BENZONATATE 100 MG PO CAPS: 100.0000 mg | ORAL_CAPSULE | Freq: Three times a day (TID) | ORAL | 0 refills | Status: AC

## 2022-03-15 MED ORDER — OSELTAMIVIR PHOSPHATE 75 MG PO CAPS: 75.0000 mg | ORAL_CAPSULE | Freq: Two times a day (BID) | ORAL | 0 refills | Status: AC

## 2022-03-15 MED ORDER — DEXAMETHASONE SODIUM PHOSPHATE 10 MG/ML IJ SOLN
10.0000 mg | Freq: Once | INTRAMUSCULAR | Status: AC
  Administered 2022-03-15: 10 mg via INTRAMUSCULAR
  Filled 2022-03-15: qty 1

## 2022-03-15 MED ORDER — BENZONATATE 100 MG PO CAPS
100.0000 mg | ORAL_CAPSULE | Freq: Once | ORAL | Status: AC
  Administered 2022-03-15: 100 mg via ORAL
  Filled 2022-03-15: qty 1

## 2022-03-15 MED ORDER — KETOROLAC TROMETHAMINE 15 MG/ML IJ SOLN
15.0000 mg | Freq: Once | INTRAMUSCULAR | Status: AC
  Administered 2022-03-15: 15 mg via INTRAMUSCULAR
  Filled 2022-03-15: qty 1

## 2022-03-15 MED ORDER — ACETAMINOPHEN 325 MG PO TABS
650.0000 mg | ORAL_TABLET | Freq: Once | ORAL | Status: AC
  Administered 2022-03-15: 650 mg via ORAL
  Filled 2022-03-15: qty 2

## 2022-03-15 NOTE — ED Triage Notes (Signed)
Reported was seen at urgent care yesterday and been feeling unwell since Monday; + FLU B; c/o worsening symptoms unable to get relief from theraflu, flonase and nasal sprays; c/o throat, chest and head pains.

## 2022-03-15 NOTE — ED Notes (Signed)
States taken motrin 600 mg around 11 am and theraflu around noon.

## 2022-03-15 NOTE — ED Provider Notes (Signed)
[facility] Provider Note   CSN: [id number] Arrival date & time: 03/15/22  1609     History  Chief Complaint  Patient presents with   URI    Jacqueline Chandler is a 36 y.o. female with no significant past medical history who presents to the ED due to worsening cough, sore throat, headache, body aches.  Patient tested positive for influenza B yesterday at urgent care.  Patient was discharged with symptomatic treatment and she notes she feels worse than before.  She admits to fever.  Admits to chest pain only while coughing.  Denies abdominal pain, nausea, vomiting, and diarrhea.  No shortness of breath.  Patient admits to sore throat.  No difficulty swallowing.  Denies change in phonation and trismus.  History obtained from patient and past medical records. No interpreter used during encounter.       Home Medications Prior to Admission medications   Medication Sig Start Date End Date Taking? Authorizing Provider  benzonatate (TESSALON) 100 MG capsule Take 1 capsule (100 mg total) by mouth every 8 (eight) hours. 03/15/22  Yes Thelton Graca, Druscilla Brownie, PA-C  oseltamivir (TAMIFLU) 75 MG capsule Take 1 capsule (75 mg total) by mouth every 12 (twelve) hours. 03/15/22  Yes Elius Etheredge, Druscilla Brownie, PA-C  cholecalciferol (VITAMIN D) 1000 UNITS tablet Take 1,000 Units by mouth daily.    [provider]  diclofenac sodium (VOLTAREN) 1 % GEL Apply 2 g topically 4 (four) times daily. 03/26/18   Darlin Drop P, PA-C  HYDROcodone-acetaminophen (NORCO/VICODIN) 5-325 MG tablet Take 2 tablets by mouth every 4 (four) hours as needed. 10/07/15   Dowless, Samantha Tripp, PA-C  ibuprofen (ADVIL,MOTRIN) 400 MG tablet Take 1 tablet (400 mg total) by mouth every 6 (six) hours as needed. 10/07/15   Dowless, Aldona Bar Tripp, PA-C  Multiple Vitamin (MULTIVITAMIN WITH MINERALS) TABS tablet Take 1 tablet by mouth daily.    [provider]  nitroGLYCERIN (NITRODUR - DOSED IN MG/24  HR) 0.2 mg/hr patch Apply 1/4th patch to affected ankle, change daily 12/27/15   Hudnall, Sharyn Lull, MD      Allergies    Patient has no known allergies.    Review of Systems   Review of Systems  Constitutional:  Positive for chills, fatigue and fever.  HENT:  Positive for sore throat.   Respiratory:  Positive for cough.   Cardiovascular:  Positive for chest pain (when coughing).  Gastrointestinal:  Negative for abdominal pain, diarrhea, nausea and vomiting.  All other systems reviewed and are negative.   Physical Exam Updated Vital Signs BP 128/83 (BP Location: Left Arm)   Pulse (!) 102   Temp (!) 101.3 F (38.5 C) (Oral)   Resp 20   Ht 5\' 2"  (1.575 m)   Wt 77.1 kg   LMP 03/03/2022   SpO2 100%   BMI 31.09 kg/m  Physical Exam Vitals and nursing note reviewed.  Constitutional:      General: She is not in acute distress.    Appearance: She is not ill-appearing.  HENT:     Head: Normocephalic.     Mouth/Throat:     Comments: Posterior oropharynx clear and mucous membranes moist, there is mild erythema but no edema or tonsillar exudates, uvula midline, normal phonation, no trismus, tolerating secretions without difficulty. Eyes:     Pupils: Pupils are equal, round, and reactive to light.  Cardiovascular:     Rate and Rhythm: Normal rate and regular rhythm.     Pulses: Normal  pulses.     Heart sounds: Normal heart sounds. No murmur heard.    No friction rub. No gallop.  Pulmonary:     Effort: Pulmonary effort is normal.     Breath sounds: Normal breath sounds.  Abdominal:     General: Abdomen is flat. There is no distension.     Palpations: Abdomen is soft.     Tenderness: There is no abdominal tenderness. There is no guarding or rebound.  Musculoskeletal:        General: Normal range of motion.     Cervical back: Neck supple.  Skin:    General: Skin is warm and dry.  Neurological:     General: No focal deficit present.     Mental Status: She is alert.   Psychiatric:        Mood and Affect: Mood normal.        Behavior: Behavior normal.     ED Results / Procedures / Treatments   Labs (all labs ordered are listed, but only abnormal results are displayed) Labs Reviewed  GROUP A STREP BY PCR  PREGNANCY, URINE    EKG EKG Interpretation  Date/Time:  Wednesday March 15 2022 19:41:01 EST Ventricular Rate:  99 PR Interval:  112 QRS Duration: 64 QT Interval:  315 QTC Calculation: 405 R Axis:   41 Text Interpretation: Age not entered, assumed to be  36 years old for purpose of ECG interpretation Sinus rhythm Borderline T abnormalities, anterior leads when compared to prior, similar appearance. No STEMI Confirmed by Theda Belfast ([id number]) on 03/15/2022 8:18:06 PM  Radiology DG Chest Portable 1 View  Result Date: 03/15/2022 CLINICAL DATA:  Chest pain EXAM: PORTABLE CHEST 1 VIEW COMPARISON:  Chest radiograph 03/22/2018 FINDINGS: The cardiomediastinal silhouette is normal. There is no focal consolidation or pulmonary edema. There is no pleural effusion or pneumothorax There is no acute osseous abnormality or suspicious osseous lesion. IMPRESSION: No radiographic evidence of acute cardiopulmonary process. Electronically Signed   By: Lesia Hausen M.D.   On: 03/15/2022 19:39    Procedures Procedures    Medications Ordered in ED Medications  acetaminophen (TYLENOL) tablet 650 mg (650 mg Oral Given 03/15/22 1906)  ketorolac (TORADOL) 15 MG/ML injection 15 mg (15 mg Intramuscular Given 03/15/22 1957)  dexamethasone (DECADRON) injection 10 mg (10 mg Intramuscular Given 03/15/22 2000)  benzonatate (TESSALON) capsule 100 mg (100 mg Oral Given 03/15/22 1956)    ED Course/ Medical Decision Making/ A&P                           Medical Decision Making Amount and/or Complexity of Data Reviewed Independent Historian: parent    Details: Father at bedside provided history External Data Reviewed: notes.    Details: UC note Labs: ordered.  Decision-making details documented in ED Course. Radiology: ordered and independent interpretation performed. Decision-making details documented in ED Course.  Risk OTC drugs. Prescription drug management.   36 year old female presents to the ED due to worsening cough, sore throat, headache, and body aches.  Patient tested positive for influenza B yesterday at urgent care.  Symptom onset 3 days ago.  Admits to chest pain only while coughing.  No shortness of breath.  Denies nausea, vomiting, diarrhea.  Patient febrile and tachycardic here in the ED.  Patient appears uncomfortable in bed.  Reassuring physical exam.  Lungs clear to auscultation bilaterally.  Abdomen soft, nondistended, nontender.  Low suspicion for acute abdomen.  TMs unremarkable bilaterally.  Doubt acute otitis media.  Throat with mild erythema.  No abscess.  Normal phonation.  Will obtain urine pregnancy test and once it comes back negative will give IM Toradol and Decadron.  Suspect symptoms related to influenza infection.  Chest x-ray and EKG given chest pain however, feel it is less likely cardiac in nature given it only occurs while coughing.  Reproducible tenderness to anterior chest wall on exam.  Strep negative.  Pregnancy test negative.  Chest x-ray personally reviewed and interpreted which negative for signs of pneumonia, pneumothorax, wide mediastinum.  EKG demonstrates normal sinus rhythm.  No signs of acute ischemia.  Low suspicion for cardiac etiology of chest pain given reproducible tenderness on exam.  8:43 PM reassessed patient at bedside after Toradol and Decadron.  Patient admits to significant improved in symptoms.  Suspect symptoms related to influenza.  Given patient is right at the window for Tamiflu, discharged with prescription.  Patient also discharged with cough medication.  No meningismus to suggest meningitis.  Advised patient follow-up with PCP symptoms not improve over the next few days. Strict ED  precautions discussed with patient. Patient states understanding and agrees to plan. Patient discharged home in no acute distress and stable vitals       Final Clinical Impression(s) / ED Diagnoses Final diagnoses:  Influenza B    Rx / DC Orders ED Discharge Orders          Ordered    benzonatate (TESSALON) 100 MG capsule  Every 8 hours        03/15/22 2046    oseltamivir (TAMIFLU) 75 MG capsule  Every 12 hours        03/15/22 2046              Karie Kirks 03/15/22 2113    Tegeler, Gwenyth Allegra, MD 03/15/22 2329

## 2022-03-15 NOTE — Discharge Instructions (Signed)
It was a pleasure taking care of you today.  As discussed, I suspect your symptoms are related to the flu.  Your chest x-ray did not show evidence of pneumonia.  You were given symptomatic treatment here in the ER.  I am discharging you with Tamiflu.  Also discharging you with cough medication.  Follow-up with PCP if symptoms do not improve over the next few days.  Return to the ER for any worsening symptoms.

## 2023-05-08 ENCOUNTER — Encounter: Payer: Self-pay | Admitting: Internal Medicine

## 2023-05-08 ENCOUNTER — Ambulatory Visit: Payer: Managed Care, Other (non HMO) | Attending: Internal Medicine | Admitting: Internal Medicine

## 2023-05-08 VITALS — BP 108/82 | HR 77 | Ht 62.0 in | Wt 189.8 lb

## 2023-05-08 DIAGNOSIS — Z136 Encounter for screening for cardiovascular disorders: Secondary | ICD-10-CM

## 2023-05-08 NOTE — Patient Instructions (Addendum)
 Medication Instructions:  None  *If you need a refill on your cardiac medications before your next appointment, please call your pharmacy*   Lab Work: None   Testing/Procedures: None   Follow-Up: At [facility], you and your health needs are our priority.  As part of our continuing mission to provide you with exceptional heart care, we have created designated Provider Care Teams.  These Care Teams include your primary Cardiologist (physician) and Advanced Practice Providers (APPs -  Physician Assistants and Nurse Practitioners) who all work together to provide you with the care you need, when you need it.  We recommend signing up for the patient portal called MyChart.  Sign up information is provided on this After Visit Summary.  MyChart is used to connect with patients for Virtual Visits (Telemedicine).  Patients are able to view lab/test results, encounter notes, upcoming appointments, etc.  Non-urgent messages can be sent to your provider as well.   To learn more about what you can do with MyChart, go to [email or web address].    Your next appointment:   Follow up as needed   Provider:   Mary branch  Other Instructions

## 2023-05-08 NOTE — Progress Notes (Signed)
  Cardiology Office Note:  .   Date:  05/08/2023  ID:  Leotis Lied, DOB [birth year], MRN [id number] PCP: Patient, No Pcp Per  [facility] Providers Cardiologist:  None    History of Present Illness: .   Jacqueline Chandler is a 38 y.o. female no significant PMHx. She had elevated CRP and was recommended to see cardiology. Mother had afib s/p ablation. Her father has no heart disease. She has no chest pain or SOB. She had a yeast infection at that time.   ROS:  per HPI otherwise negative  Studies Reviewed: SABRA        EKG Interpretation Date/Time:  Tuesday May 08 2023 13:49:30 EST Ventricular Rate:  77 PR Interval:  128 QRS Duration:  70 QT Interval:  348 QTC Calculation: 393 R Axis:   13  Text Interpretation: Normal sinus rhythm with sinus arrhythmia Normal ECG When compared with ECG of 15-Mar-2022 19:41, Confirmed by Alvan Shuck ([id number]) on 05/08/2023 2:03:09 PM  Risk Assessment/Calculations:      Physical Exam:   VS:  Ht 5' 2 (1.575 m)   Wt 189 lb 12.8 oz (86.1 kg)   BMI 34.71 kg/m    Wt Readings from Last 3 Encounters:  05/08/23 189 lb 12.8 oz (86.1 kg)  03/15/22 170 lb (77.1 kg)  03/26/18 177 lb (80.3 kg)    GEN: Well nourished, well developed in no acute distress NECK: No JVD; No carotid bruits CARDIAC: RRR, no murmurs, rubs, gallops RESPIRATORY:  Clear to auscultation without rales, wheezing or rhonchi  ABDOMEN: Soft, non-tender, non-distended EXTREMITIES:  No edema; No deformity   ASSESSMENT AND PLAN: .   No heart disease Elevated CRP She had an isolated elevated CRP.  She has no cardiovascular disease risk factors.  She had an ongoing yeast infection which may explain her elevated CRP. She can start a GLP1       Dispo: PRN FU  Signed, Alvan Shuck BRAVO, MD
